# Patient Record
Sex: Female | Born: 1955
Health system: Southern US, Community
[De-identification: ages and names within clinical notes are randomized; demographics above are authoritative.]

## PROBLEM LIST (undated history)

## (undated) DIAGNOSIS — N83209 Unspecified ovarian cyst, unspecified side: Secondary | ICD-10-CM

## (undated) DIAGNOSIS — T8859XA Other complications of anesthesia, initial encounter: Secondary | ICD-10-CM

## (undated) DIAGNOSIS — Z8489 Family history of other specified conditions: Secondary | ICD-10-CM

## (undated) DIAGNOSIS — Z974 Presence of external hearing-aid: Secondary | ICD-10-CM

## (undated) DIAGNOSIS — T4145XA Adverse effect of unspecified anesthetic, initial encounter: Secondary | ICD-10-CM

## (undated) DIAGNOSIS — Z9889 Other specified postprocedural states: Secondary | ICD-10-CM

## (undated) DIAGNOSIS — T7840XA Allergy, unspecified, initial encounter: Secondary | ICD-10-CM

## (undated) DIAGNOSIS — R112 Nausea with vomiting, unspecified: Secondary | ICD-10-CM

## (undated) DIAGNOSIS — M1990 Unspecified osteoarthritis, unspecified site: Secondary | ICD-10-CM

## (undated) DIAGNOSIS — N390 Urinary tract infection, site not specified: Secondary | ICD-10-CM

## (undated) HISTORY — DX: Unspecified ovarian cyst, unspecified side: N83.209

## (undated) HISTORY — DX: Family history of other specified conditions: Z84.89

## (undated) HISTORY — DX: Urinary tract infection, site not specified: N39.0

## (undated) HISTORY — DX: Allergy, unspecified, initial encounter: T78.40XA

## (undated) HISTORY — DX: Unspecified osteoarthritis, unspecified site: M19.90

---

## 2004-09-27 ENCOUNTER — Ambulatory Visit: Payer: Self-pay | Admitting: Unknown Physician Specialty

## 2009-09-27 ENCOUNTER — Ambulatory Visit: Payer: Self-pay

## 2009-10-12 ENCOUNTER — Ambulatory Visit: Payer: Self-pay | Admitting: Podiatry

## 2012-04-23 ENCOUNTER — Ambulatory Visit (INDEPENDENT_AMBULATORY_CARE_PROVIDER_SITE_OTHER): Payer: PRIVATE HEALTH INSURANCE | Admitting: Internal Medicine

## 2012-04-23 ENCOUNTER — Encounter: Payer: Self-pay | Admitting: Internal Medicine

## 2012-04-23 DIAGNOSIS — Z1211 Encounter for screening for malignant neoplasm of colon: Secondary | ICD-10-CM | POA: Insufficient documentation

## 2012-04-23 DIAGNOSIS — Z79899 Other long term (current) drug therapy: Secondary | ICD-10-CM | POA: Insufficient documentation

## 2012-04-23 DIAGNOSIS — Z0001 Encounter for general adult medical examination with abnormal findings: Secondary | ICD-10-CM | POA: Insufficient documentation

## 2012-04-23 DIAGNOSIS — T781XXA Other adverse food reactions, not elsewhere classified, initial encounter: Secondary | ICD-10-CM

## 2012-04-23 DIAGNOSIS — Z0000 Encounter for general adult medical examination without abnormal findings: Secondary | ICD-10-CM

## 2012-04-23 DIAGNOSIS — Z1239 Encounter for other screening for malignant neoplasm of breast: Secondary | ICD-10-CM | POA: Insufficient documentation

## 2012-04-23 DIAGNOSIS — M1990 Unspecified osteoarthritis, unspecified site: Secondary | ICD-10-CM | POA: Insufficient documentation

## 2012-04-23 DIAGNOSIS — Z91018 Allergy to other foods: Secondary | ICD-10-CM | POA: Insufficient documentation

## 2012-04-23 DIAGNOSIS — M129 Arthropathy, unspecified: Secondary | ICD-10-CM

## 2012-04-23 LAB — CBC WITH DIFFERENTIAL/PLATELET
Basophils Relative: 0.5 % (ref 0.0–3.0)
Eosinophils Absolute: 0 10*3/uL (ref 0.0–0.7)
HCT: 37.9 % (ref 36.0–46.0)
Hemoglobin: 13 g/dL (ref 12.0–15.0)
Lymphocytes Relative: 28.3 % (ref 12.0–46.0)
Lymphs Abs: 1.2 10*3/uL (ref 0.7–4.0)
MCHC: 34.3 g/dL (ref 30.0–36.0)
MCV: 87.9 fl (ref 78.0–100.0)
Monocytes Absolute: 0.4 10*3/uL (ref 0.1–1.0)
Neutro Abs: 2.6 10*3/uL (ref 1.4–7.7)
RBC: 4.31 Mil/uL (ref 3.87–5.11)

## 2012-04-23 LAB — COMPREHENSIVE METABOLIC PANEL
ALT: 21 U/L (ref 0–35)
AST: 23 U/L (ref 0–37)
Calcium: 9.7 mg/dL (ref 8.4–10.5)
Chloride: 103 mEq/L (ref 96–112)
Creatinine, Ser: 0.7 mg/dL (ref 0.4–1.2)
Sodium: 137 mEq/L (ref 135–145)

## 2012-04-23 LAB — LIPID PANEL
HDL: 46.1 mg/dL (ref >39.00)
Total CHOL/HDL Ratio: 4

## 2012-04-23 NOTE — Assessment & Plan Note (Signed)
Patient reports intolerance to foods but is unsure about exact foods which trigger symptoms. Encouraged her to keep a food diary. Will set up allergy testing through allergy/immunology.

## 2012-04-23 NOTE — Assessment & Plan Note (Signed)
Symptoms in her right knee are most consistent with osteoarthritis. Other arthralgias are likely also related to OA. Will check ANA with labs today as screen for RA. Continue Glucosamine/Chondroitin.

## 2012-04-23 NOTE — Progress Notes (Signed)
Subjective:    Patient ID: Cassandra Malone, female    DOB: 1955/08/08, 57 y.o.   MRN: [id number]  HPI 57 year old female presents to establish care. She reports that she has not been seen by a physician in a couple of years. She has been generally healthy. She has 2 concerns today. First, she notes a long history of food allergy and intolerance. She is unsure what she is allergic to, but reports intermittent episodes of shortness of breath and general malaise associated with eating certain foods. She has never been tested for allergies. She denies any prior history of anaphylaxis. She denies any diarrhea, abdominal pain. She denies any weight loss.  Her second concern today is intermittent history of joint pain. She reports that this occurs to all of the joints of her body. It does not seem to be worse at any particular time. She denies joint swelling or redness. She was previously evaluated by an orthopedic surgeon for pain in her right knee and they recommended steroid injection which she declined. She does not currently take any prescription medications for joint pain. She does take glucosamine/chondroitin with some improvement.   Outpatient Encounter Prescriptions as of 04/23/2012  Medication Sig Dispense Refill  . vitamin C (ASCORBIC ACID) 500 MG tablet Take 500 mg by mouth 2 (two) times daily.       No facility-administered encounter medications on file as of 04/23/2012.   BP 128/90  Pulse 75  Temp(Src) 98.3 F (36.8 C) (Oral)  Ht 5' 3.75" (1.619 m)  Wt 150 lb (68.04 kg)  BMI 25.96 kg/m2  SpO2 96%  Review of Systems  Constitutional: Negative for fever, chills, appetite change, fatigue and unexpected weight change.  HENT: Negative for ear pain, congestion, sore throat, trouble swallowing, neck pain, voice change and sinus pressure.   Eyes: Negative for visual disturbance.  Respiratory: Negative for cough, shortness of breath, wheezing and stridor.   Cardiovascular: Negative for chest pain,  palpitations and leg swelling.  Gastrointestinal: Negative for nausea, vomiting, abdominal pain, diarrhea, constipation, blood in stool, abdominal distention and anal bleeding.  Genitourinary: Negative for dysuria and flank pain.  Musculoskeletal: Positive for arthralgias. Negative for myalgias and gait problem.  Skin: Negative for color change and rash.  Neurological: Negative for dizziness and headaches.  Hematological: Negative for adenopathy. Does not bruise/bleed easily.  Psychiatric/Behavioral: Negative for suicidal ideas, sleep disturbance and dysphoric mood. The patient is not nervous/anxious.        Objective:   Physical Exam  Constitutional: She is oriented to person, place, and time. She appears well-developed and well-nourished. No distress.  HENT:  Head: Normocephalic and atraumatic.  Right Ear: External ear normal.  Left Ear: External ear normal.  Nose: Nose normal.  Mouth/Throat: Oropharynx is clear and moist. No oropharyngeal exudate.  Eyes: Conjunctivae are normal. Pupils are equal, round, and reactive to light. Right eye exhibits no discharge. Left eye exhibits no discharge. No scleral icterus.  Neck: Normal range of motion. Neck supple. No tracheal deviation present. No thyromegaly present.  Cardiovascular: Normal rate, regular rhythm, normal heart sounds and intact distal pulses.  Exam reveals no gallop and no friction rub.   No murmur heard. Pulmonary/Chest: Effort normal and breath sounds normal. No respiratory distress. She has no wheezes. She has no rales. She exhibits no tenderness.  Abdominal: Soft. Bowel sounds are normal. She exhibits no distension and no mass. There is no tenderness. There is no rebound and no guarding.  Musculoskeletal: Normal range of motion. She  exhibits no edema and no tenderness.  Lymphadenopathy:    She has no cervical adenopathy.  Neurological: She is alert and oriented to person, place, and time. No cranial nerve deficit. She exhibits  normal muscle tone. Coordination normal.  Skin: Skin is warm and dry. No rash noted. She is not diaphoretic. No erythema. No pallor.  Psychiatric: Her speech is normal and behavior is normal. Judgment and thought content normal. Her mood appears anxious. Cognition and memory are normal.          Assessment & Plan:

## 2012-04-23 NOTE — Assessment & Plan Note (Signed)
Will schedule exam and PAP smear. Will check basic labs today including CMP, CBC, TSH, Vit D, lipids.

## 2012-04-23 NOTE — Assessment & Plan Note (Addendum)
Will screening mammogram.

## 2012-04-23 NOTE — Assessment & Plan Note (Signed)
Pt is taking numerous supplements. Discussed risks of using supplements with unknown risks and benefits. Encouraged her to consider cutting back or preferably stopping use of excessive supplements. Recommended that she stop Calcium supplement, given FDA recommendation 2013.

## 2012-04-24 ENCOUNTER — Encounter: Payer: Self-pay | Admitting: *Deleted

## 2012-04-24 ENCOUNTER — Other Ambulatory Visit: Payer: Self-pay | Admitting: *Deleted

## 2012-04-24 LAB — ANA: Anti Nuclear Antibody(ANA): NEGATIVE

## 2012-06-12 ENCOUNTER — Ambulatory Visit: Payer: Self-pay | Admitting: Internal Medicine

## 2012-07-10 ENCOUNTER — Encounter: Payer: PRIVATE HEALTH INSURANCE | Admitting: Internal Medicine

## 2012-08-01 ENCOUNTER — Encounter: Payer: PRIVATE HEALTH INSURANCE | Admitting: Internal Medicine

## 2012-10-02 ENCOUNTER — Ambulatory Visit (INDEPENDENT_AMBULATORY_CARE_PROVIDER_SITE_OTHER): Payer: PRIVATE HEALTH INSURANCE | Admitting: Internal Medicine

## 2012-10-02 ENCOUNTER — Encounter: Payer: PRIVATE HEALTH INSURANCE | Admitting: Internal Medicine

## 2012-10-02 ENCOUNTER — Encounter: Payer: Self-pay | Admitting: Internal Medicine

## 2012-10-02 VITALS — BP 114/80 | HR 68 | Temp 98.4°F

## 2012-10-02 DIAGNOSIS — N390 Urinary tract infection, site not specified: Secondary | ICD-10-CM | POA: Insufficient documentation

## 2012-10-02 LAB — POCT URINALYSIS DIPSTICK
Bilirubin, UA: NEGATIVE
Glucose, UA: NEGATIVE
Ketones, UA: NEGATIVE
Spec Grav, UA: 1.01
Urobilinogen, UA: 0.2

## 2012-10-02 MED ORDER — CIPROFLOXACIN HCL 500 MG PO TABS: 500.0000 mg | ORAL_TABLET | Freq: Two times a day (BID) | ORAL | Status: DC

## 2012-10-02 NOTE — Progress Notes (Signed)
Subjective:    Patient ID: Cassandra Malone, female    DOB: Sep 12, 1955, 57 y.o.   MRN: [id number]  HPI 57 year old female presents for acute visit complaining of several days of increased urinary frequency, dysuria described as burning with urination, urinary urgency, and low back pain. She has been taking over-the-counter remedies for UTI with no improvement. She denies flank pain, fever, chills.  Outpatient Prescriptions Prior to Visit  Medication Sig Dispense Refill  . ALPHA LIPOIC ACID PO Take 250 mg by mouth daily.      . Calcium Carbonate (CALCIUM 600 PO) Take 600 mg by mouth daily.      Marland Kitchen co-enzyme Q-10 50 MG capsule Take 100 mg by mouth daily.      . Emollient (DMAE EX) Apply topically.      . Grape Seed 100 MG CAPS Take 100 mg by mouth daily.      . Magnesium 100 MG CAPS Take 125 mg by mouth 3 (three) times daily.      . Omega-3 Fatty Acids (FISH OIL) 500 MG CAPS Take 500 mg by mouth daily.      . [place] Grape Root POWD by Does not apply route.      Marland Kitchen OVER THE COUNTER MEDICATION Take 450 mg by mouth daily. [facility]      . SPECIALTY VITAMINS PRODUCTS PO Take by mouth.      Marland Kitchen UNABLE TO FIND Take 500 mg by mouth 2 (two) times daily. Dmannose with cranactin      . vitamin C (ASCORBIC ACID) 500 MG tablet Take 500 mg by mouth 2 (two) times daily.      Marland Kitchen Cetyl Myristoleate POWD 500 mg by Does not apply route daily.      . Methylsulfonylmethane (MSM) 1000 MG CAPS Take 1,000 mg by mouth daily.       No facility-administered medications prior to visit.   BP 114/80  Pulse 68  Temp(Src) 98.4 F (36.9 C) (Oral)  SpO2 97%  Review of Systems  Constitutional: Negative for fever, chills and fatigue.  Gastrointestinal: Negative for nausea, vomiting, abdominal pain, diarrhea, constipation and rectal pain.  Genitourinary: Positive for dysuria, urgency and frequency. Negative for hematuria, flank pain, decreased urine volume, vaginal bleeding, vaginal discharge, difficulty urinating, vaginal  pain and pelvic pain.  Musculoskeletal: Positive for back pain.       Objective:   Physical Exam  Constitutional: She is oriented to person, place, and time. She appears well-developed and well-nourished. No distress.  HENT:  Head: Normocephalic and atraumatic.  Right Ear: External ear normal.  Left Ear: External ear normal.  Nose: Nose normal.  Mouth/Throat: Oropharynx is clear and moist. No oropharyngeal exudate.  Eyes: Conjunctivae are normal. Pupils are equal, round, and reactive to light. Right eye exhibits no discharge. Left eye exhibits no discharge. No scleral icterus.  Neck: Normal range of motion. Neck supple. No tracheal deviation present. No thyromegaly present.  Cardiovascular: Normal rate, regular rhythm, normal heart sounds and intact distal pulses.  Exam reveals no gallop and no friction rub.   No murmur heard. Pulmonary/Chest: Effort normal and breath sounds normal. No accessory muscle usage. Not tachypneic. No respiratory distress. She has no decreased breath sounds. She has no wheezes. She has no rhonchi. She has no rales. She exhibits no tenderness.  Musculoskeletal: Normal range of motion. She exhibits no edema and no tenderness.  Lymphadenopathy:    She has no cervical adenopathy.  Neurological: She is alert and oriented to person, place,  and time. No cranial nerve deficit. She exhibits normal muscle tone. Coordination normal.  Skin: Skin is warm and dry. No rash noted. She is not diaphoretic. No erythema. No pallor.  Psychiatric: She has a normal mood and affect. Her behavior is normal. Judgment and thought content normal.          Assessment & Plan:

## 2012-10-02 NOTE — Assessment & Plan Note (Signed)
Symptoms consistent with urinary tract infection. Urinalysis positive for blood. Will send urine for culture. Will start empiric Cipro. Encouraged increased fluid intake and use of Pyridium as needed. Patient will followup if symptoms are not improving or if any new symptoms develop including flank pain or fever.

## 2012-10-04 LAB — URINE CULTURE
Colony Count: NO GROWTH
Organism ID, Bacteria: NO GROWTH

## 2012-10-09 ENCOUNTER — Telehealth: Payer: Self-pay | Admitting: Internal Medicine

## 2012-10-09 ENCOUNTER — Ambulatory Visit: Payer: Self-pay | Admitting: Internal Medicine

## 2012-10-09 ENCOUNTER — Ambulatory Visit (INDEPENDENT_AMBULATORY_CARE_PROVIDER_SITE_OTHER): Payer: PRIVATE HEALTH INSURANCE | Admitting: Internal Medicine

## 2012-10-09 ENCOUNTER — Encounter: Payer: Self-pay | Admitting: Internal Medicine

## 2012-10-09 VITALS — BP 120/90 | HR 66 | Temp 98.3°F

## 2012-10-09 DIAGNOSIS — R319 Hematuria, unspecified: Secondary | ICD-10-CM

## 2012-10-09 DIAGNOSIS — N83202 Unspecified ovarian cyst, left side: Secondary | ICD-10-CM

## 2012-10-09 DIAGNOSIS — R109 Unspecified abdominal pain: Secondary | ICD-10-CM

## 2012-10-09 LAB — POCT URINALYSIS DIPSTICK
Glucose, UA: NEGATIVE
Leukocytes, UA: NEGATIVE
Nitrite, UA: NEGATIVE
Urobilinogen, UA: 0.2

## 2012-10-09 NOTE — Assessment & Plan Note (Signed)
Hematuria noted on urinalysis today and last week. Urine culture negative. CT shows no evidence of nephrolithiasis. Will send formal UA. If pos for Hgb, will set up urology evaluation for possible cystoscopy.

## 2012-10-09 NOTE — Progress Notes (Signed)
Subjective:    Patient ID: Cassandra Malone, female    DOB: Jun 25, 1955, 57 y.o.   MRN: [id number]  HPI 56YO female presents for follow up dysuria. She was seen 1 week ago with complaints of burning with urination. Urinalysis was pos for blood. She was treated with Cipro. Urine culture was negative. Her symptoms have been persistent. She also describes pressure or pain in her right lower pelvis. She has not had any change in bowel habits, nausea, fever, chills. She occasionally has mild low back pain described as aching.  She has a history of UTIs in the past, which required several weeks of antibiotics. She has no history of nephrolithiasis. She has a history of ovarian cysts in the past.  Outpatient Encounter Prescriptions as of 10/09/2012  Medication Sig Dispense Refill  . ALPHA LIPOIC ACID PO Take 250 mg by mouth daily.      . Calcium Carbonate (CALCIUM 600 PO) Take 600 mg by mouth daily.      Marland Kitchen Cetyl Myristoleate POWD 500 mg by Does not apply route daily.      . ciprofloxacin (CIPRO) 500 MG tablet Take 1 tablet (500 mg total) by mouth 2 (two) times daily.  14 tablet  0  . co-enzyme Q-10 50 MG capsule Take 100 mg by mouth daily.      . D-MANNOSE PO Take by mouth 2 (two) times daily.      . Emollient (DMAE EX) Apply topically.      . Grape Seed 100 MG CAPS Take 100 mg by mouth daily.      . Magnesium 100 MG CAPS Take 125 mg by mouth 3 (three) times daily.      . Methylsulfonylmethane (MSM) 1000 MG CAPS Take 1,000 mg by mouth daily.      . Omega-3 Fatty Acids (FISH OIL) 500 MG CAPS Take 500 mg by mouth daily.      . [place] Grape Root POWD by Does not apply route.      Marland Kitchen OVER THE COUNTER MEDICATION Take 450 mg by mouth daily. [facility]      . SPECIALTY VITAMINS PRODUCTS PO Take by mouth.      Marland Kitchen UNABLE TO FIND Take 500 mg by mouth 2 (two) times daily. Dmannose with cranactin      . vitamin C (ASCORBIC ACID) 500 MG tablet Take 500 mg by mouth 2 (two) times daily.       No facility-administered  encounter medications on file as of 10/09/2012.   BP 120/90  Pulse 66  Temp(Src) 98.3 F (36.8 C) (Oral)  SpO2 98%  Review of Systems  Constitutional: Negative for fever, chills and fatigue.  Gastrointestinal: Positive for abdominal pain (right lower abdominal ). Negative for nausea, vomiting, diarrhea, constipation and rectal pain.  Genitourinary: Positive for dysuria and flank pain. Negative for urgency, frequency, hematuria, decreased urine volume, vaginal bleeding, vaginal discharge, difficulty urinating, vaginal pain and pelvic pain.       Objective:   Physical Exam  Constitutional: She is oriented to person, place, and time. She appears well-developed and well-nourished. No distress.  HENT:  Head: Normocephalic and atraumatic.  Right Ear: External ear normal.  Left Ear: External ear normal.  Nose: Nose normal.  Mouth/Throat: Oropharynx is clear and moist. No oropharyngeal exudate.  Eyes: Conjunctivae are normal. Pupils are equal, round, and reactive to light. Right eye exhibits no discharge. Left eye exhibits no discharge. No scleral icterus.  Neck: Normal range of motion. Neck supple. No tracheal  deviation present. No thyromegaly present.  Cardiovascular: Normal rate, regular rhythm, normal heart sounds and intact distal pulses.  Exam reveals no gallop and no friction rub.   No murmur heard. Pulmonary/Chest: Effort normal and breath sounds normal. No accessory muscle usage. Not tachypneic. No respiratory distress. She has no decreased breath sounds. She has no wheezes. She has no rhonchi. She has no rales. She exhibits no tenderness.  Abdominal: Soft. Bowel sounds are normal. She exhibits no distension and no mass. There is tenderness (right lower quadrant mild). There is no rebound and no guarding.  Musculoskeletal: Normal range of motion. She exhibits no edema and no tenderness.  Lymphadenopathy:    She has no cervical adenopathy.  Neurological: She is alert and oriented to  person, place, and time. No cranial nerve deficit. She exhibits normal muscle tone. Coordination normal.  Skin: Skin is warm and dry. No rash noted. She is not diaphoretic. No erythema. No pallor.  Psychiatric: She has a normal mood and affect. Her behavior is normal. Judgment and thought content normal.          Assessment & Plan:

## 2012-10-09 NOTE — Telephone Encounter (Signed)
Patient stopped by and asked if she was still supposed to be taking cipro and if so she uses [facility]. Please call patient and advise if she needs to continue taking medication.

## 2012-10-09 NOTE — Telephone Encounter (Signed)
Received report on pt CT abdomen. No kidney stones were seen, however there is a 3cm left ovarian cyst. I would like to get a pelvic ultrasound for further evaluation. I will place order. We will need to let pt know. We had discussed possible [place]. She can stop the Cipro. Please have Eber Jones send her urine for formal urinalysis in the lab.

## 2012-10-09 NOTE — Assessment & Plan Note (Signed)
Unclear etiology of right flank and pelvic pain. Hematuria noted again today on urinalysis. However, CT abdomen showed no nephrolithiasis. Will proceed with pelvic ultrasound to further evaluate ovarian cyst seen in the left adenexa. Will also send urine for formal urinalysis.

## 2012-10-09 NOTE — Telephone Encounter (Signed)
Patient informed and verbalized understanding

## 2012-10-10 ENCOUNTER — Ambulatory Visit: Payer: Self-pay | Admitting: Internal Medicine

## 2012-10-10 LAB — URINALYSIS
Hgb urine dipstick: NEGATIVE
Urine Glucose: NEGATIVE
Urobilinogen, UA: 0.2 (ref 0.0–1.0)

## 2012-10-10 NOTE — Addendum Note (Signed)
Addended by: Montine Circle D on: 10/10/2012 08:22 AM   Modules accepted: Orders

## 2012-10-13 ENCOUNTER — Telehealth: Payer: Self-pay | Admitting: Internal Medicine

## 2012-10-13 DIAGNOSIS — N83209 Unspecified ovarian cyst, unspecified side: Secondary | ICD-10-CM

## 2012-10-13 NOTE — Telephone Encounter (Signed)
Recent pelvic ultrasound on 9/19 showed left ovarian cyst measuring 3.39cm diameter. I would recommend that we set up GYN evaluation. The cyst appears to be a simple cyst with no other abnormalities, however we should have them follow it with serial ultrasounds to make sure it resolves.

## 2012-10-13 NOTE — Telephone Encounter (Signed)
Spoke with patient, she state she went through this a couple years ago with a GYN. She think she gave you a copy of this information from Dr. Dear. But she is concerned about what is wrong with her, she has finished the antibiotics like she was supposed to do but she still does not feel like herself. She would like to know what is wrong with her.

## 2012-10-13 NOTE — Telephone Encounter (Signed)
OK. Let's make her a follow up visit and set up GYN evaluation.

## 2012-10-13 NOTE — Telephone Encounter (Signed)
Left message to call back  

## 2012-10-13 NOTE — Telephone Encounter (Signed)
Cassandra Malone, when she calls back will you ask her a preference on who she would like to see.

## 2012-10-14 NOTE — Telephone Encounter (Signed)
I called patient to double check that Dr. Dear is who she wants to see. The patient states she has seen Dr. Dear and she said that she had the same L cyst when visiting her in the past. She said she was feeling 100x better but now she's starting to have that same cramping sensation in the R side, its not as bad as it was when she came in however she feels as if whatever this is might be coming back. She says this isn't the first time she's has these issues but once taken the antibiotic she is normally better, but doesn't understand how nothing can show in her urine or imaging.

## 2012-10-14 NOTE — Telephone Encounter (Signed)
Well. We can repeat a urinalysis and urine culture and we should see her back in a visit.

## 2012-10-17 NOTE — Telephone Encounter (Signed)
Left message to call back  

## 2012-10-21 NOTE — Telephone Encounter (Signed)
Left detailed message on patient voicemail informing her to come in for an urine sample, no appointment needed.

## 2012-10-24 ENCOUNTER — Encounter: Payer: Self-pay | Admitting: Internal Medicine

## 2012-11-05 ENCOUNTER — Encounter: Payer: PRIVATE HEALTH INSURANCE | Admitting: Internal Medicine

## 2012-11-20 ENCOUNTER — Ambulatory Visit (INDEPENDENT_AMBULATORY_CARE_PROVIDER_SITE_OTHER): Payer: PRIVATE HEALTH INSURANCE | Admitting: Internal Medicine

## 2012-11-20 ENCOUNTER — Encounter: Payer: Self-pay | Admitting: Internal Medicine

## 2012-11-20 VITALS — BP 112/80 | HR 59 | Temp 98.3°F

## 2012-11-20 DIAGNOSIS — N83202 Unspecified ovarian cyst, left side: Secondary | ICD-10-CM | POA: Insufficient documentation

## 2012-11-20 DIAGNOSIS — R109 Unspecified abdominal pain: Secondary | ICD-10-CM

## 2012-11-20 DIAGNOSIS — N83209 Unspecified ovarian cyst, unspecified side: Secondary | ICD-10-CM

## 2012-11-20 NOTE — Assessment & Plan Note (Signed)
Left ovarian cyst 3.39 cm noted on US pelvis. Encouraged pt to follow up with her OB for repeat US to make sure that cyst resolves. No current symptoms of pain. Pt will call if any new symptoms develop

## 2012-11-20 NOTE — Assessment & Plan Note (Signed)
Symptoms have completely resolved. Question if she may have had a small stone we did not catch on CT. Will continue to monitor for any recurrence.

## 2012-11-20 NOTE — Progress Notes (Signed)
Subjective:    Patient ID: Cassandra Malone, female    DOB: Mar 11, 1955, 57 y.o.   MRN: [id number]  HPI 57YO female with recent episode of right LQ pain presents for follow up. CT abdomen showed possible left ovarian cyst. Follow up US pelvis confirmed 3.39 cm left ovarian cyst. Pt reports pain in right side of abdomen has completely resolved. No pain, nausea, vomiting, change in bowel habits, fever, chills, change in appetite. No urinary symptoms such as dysuria, hematuria, urgency, frequency. She is planning to set up evaluation with her GYN for repeat US, however has not yet done this. No new concerns today.   Outpatient Encounter Prescriptions as of 11/20/2012  Medication Sig Dispense Refill  . ALPHA LIPOIC ACID PO Take 250 mg by mouth daily.      . Calcium Carbonate (CALCIUM 600 PO) Take 600 mg by mouth daily.      Marland Kitchen Cetyl Myristoleate POWD 500 mg by Does not apply route daily.      Marland Kitchen co-enzyme Q-10 50 MG capsule Take 100 mg by mouth daily.      . D-MANNOSE PO Take by mouth 2 (two) times daily.      . Emollient (DMAE EX) Apply topically.      . Grape Seed 100 MG CAPS Take 100 mg by mouth daily.      . Magnesium 100 MG CAPS Take 125 mg by mouth 3 (three) times daily.      . Methylsulfonylmethane (MSM) 1000 MG CAPS Take 1,000 mg by mouth daily.      . Omega-3 Fatty Acids (FISH OIL) 500 MG CAPS Take 500 mg by mouth daily.      . [place] Grape Root POWD by Does not apply route.      Marland Kitchen OVER THE COUNTER MEDICATION Take 450 mg by mouth daily. [facility]      . SPECIALTY VITAMINS PRODUCTS PO Take by mouth.      Marland Kitchen UNABLE TO FIND Take 500 mg by mouth 2 (two) times daily. Dmannose with cranactin      . vitamin C (ASCORBIC ACID) 500 MG tablet Take 500 mg by mouth 2 (two) times daily.      . [DISCONTINUED] ciprofloxacin (CIPRO) 500 MG tablet Take 1 tablet (500 mg total) by mouth 2 (two) times daily.  14 tablet  0   No facility-administered encounter medications on file as of 11/20/2012.   BP 112/80   Pulse 59  Temp(Src) 98.3 F (36.8 C) (Oral)  SpO2 98%  Review of Systems  Constitutional: Negative for fever, chills, appetite change, fatigue and unexpected weight change.  HENT: Negative for congestion.   Eyes: Negative for pain, discharge, redness and visual disturbance.  Respiratory: Negative for cough, chest tightness, shortness of breath, wheezing and stridor.   Cardiovascular: Negative for chest pain, palpitations and leg swelling.  Gastrointestinal: Negative for nausea, vomiting, abdominal pain, diarrhea, constipation, blood in stool, abdominal distention and anal bleeding.  Genitourinary: Negative for dysuria and flank pain.  Musculoskeletal: Negative for arthralgias, gait problem, myalgias, neck pain and neck stiffness.  Skin: Negative for color change and rash.  Neurological: Negative for dizziness, weakness, light-headedness and headaches.  Hematological: Negative for adenopathy. Does not bruise/bleed easily.  Psychiatric/Behavioral: Negative for suicidal ideas, sleep disturbance and dysphoric mood. The patient is not nervous/anxious.        Objective:   Physical Exam  Constitutional: She is oriented to person, place, and time. She appears well-developed and well-nourished. No distress.  HENT:  Head: Normocephalic and atraumatic.  Right Ear: External ear normal.  Left Ear: External ear normal.  Nose: Nose normal.  Mouth/Throat: Oropharynx is clear and moist. No oropharyngeal exudate.  Eyes: Conjunctivae are normal. Pupils are equal, round, and reactive to light. Right eye exhibits no discharge. Left eye exhibits no discharge. No scleral icterus.  Neck: Normal range of motion. Neck supple. No tracheal deviation present. No thyromegaly present.  Cardiovascular: Normal rate, regular rhythm, normal heart sounds and intact distal pulses.  Exam reveals no gallop and no friction rub.   No murmur heard. Pulmonary/Chest: Effort normal and breath sounds normal. No accessory muscle  usage. Not tachypneic. No respiratory distress. She has no decreased breath sounds. She has no wheezes. She has no rhonchi. She has no rales. She exhibits no tenderness.  Abdominal: Soft. Bowel sounds are normal. She exhibits no distension and no mass. There is no tenderness. There is no rebound and no guarding.  Musculoskeletal: Normal range of motion. She exhibits no edema and no tenderness.  Lymphadenopathy:    She has no cervical adenopathy.  Neurological: She is alert and oriented to person, place, and time. No cranial nerve deficit. She exhibits normal muscle tone. Coordination normal.  Skin: Skin is warm and dry. No rash noted. She is not diaphoretic. No erythema. No pallor.  Psychiatric: She has a normal mood and affect. Her behavior is normal. Judgment and thought content normal.          Assessment & Plan:

## 2013-01-19 ENCOUNTER — Telehealth: Payer: Self-pay | Admitting: Internal Medicine

## 2013-01-19 NOTE — Telephone Encounter (Signed)
FYI

## 2013-01-19 NOTE — Telephone Encounter (Signed)
Patient Information:  Caller Name: [place]  Phone: [phone]  Patient: Cassandra Malone, Cassandra Malone  Gender: Female  DOB: January 27, 1955  Age: 58 Years  PCP: Ronna Polio (Adults only)  Office Follow Up:  Does the office need to follow up with this patient?: No  Instructions For The Office: N/A   Symptoms  Reason For Call & Symptoms: Has nasal drainage/cough since 12-23. Cough began 12-27. Is productive cough. Mucus is yellow in color.  Reviewed Health History In EMR: Yes  Reviewed Medications In EMR: Yes  Reviewed Allergies In EMR: Yes  Reviewed Surgeries / Procedures: Yes  Date of Onset of Symptoms: 01/13/2013  Guideline(s) Used:  Cough  Disposition Per Guideline:   Home Care  Reason For Disposition Reached:   Cough with no complications  Advice Given:  Cough Medicines:  OTC Cough Drops: Cough drops can help a lot, especially for mild coughs. They reduce coughing by soothing your irritated throat and removing that tickle sensation in the back of the throat. Cough drops also have the advantage of portability - you can carry them with you.  Coughing Spasms:  Drink warm fluids. Inhale warm mist (Reason: both relax the airway and loosen up the phlegm).  Suck on cough drops or hard candy to coat the irritated throat.  Call Back If:  Difficulty breathing  Cough lasts more than 3 weeks  You become worse.  Patient Will Follow Care Advice:  YES

## 2013-10-20 ENCOUNTER — Ambulatory Visit (INDEPENDENT_AMBULATORY_CARE_PROVIDER_SITE_OTHER): Payer: PRIVATE HEALTH INSURANCE | Admitting: Internal Medicine

## 2013-10-20 ENCOUNTER — Encounter: Payer: Self-pay | Admitting: Internal Medicine

## 2013-10-20 ENCOUNTER — Ambulatory Visit: Payer: PRIVATE HEALTH INSURANCE | Admitting: Internal Medicine

## 2013-10-20 VITALS — BP 110/70 | HR 82 | Temp 98.2°F | Wt 128.0 lb

## 2013-10-20 DIAGNOSIS — M545 Low back pain, unspecified: Secondary | ICD-10-CM

## 2013-10-20 DIAGNOSIS — R3915 Urgency of urination: Secondary | ICD-10-CM

## 2013-10-20 LAB — POCT URINALYSIS DIPSTICK
Glucose, UA: NEGATIVE
Ketones, UA: NEGATIVE
NITRITE UA: NEGATIVE
Protein, UA: NEGATIVE
RBC UA: NEGATIVE
SPEC GRAV UA: 1.01
Urobilinogen, UA: NEGATIVE
pH, UA: 6

## 2013-10-20 MED ORDER — NITROFURANTOIN MONOHYD MACRO 100 MG PO CAPS: 100.0000 mg | ORAL_CAPSULE | Freq: Two times a day (BID) | ORAL | Status: DC

## 2013-10-20 NOTE — Progress Notes (Signed)
HPI  Pt presents to the clinic today with c/o urinary urgency, back pain and bladder pressure. She reports this started 4 days ago. She denies pain with urination or blood in urine. She denies fever, chills or nausea. She reports that she had some leftover antibiotic from a previous UTI, and cranberry tablets with some relief.   Review of Systems  Past Medical History  Diagnosis Date  . Arthritis   . Allergy     food, never tested    Family History  Problem Relation Age of Onset  . Adopted: Yes    History   Social History  . Marital Status: Married    Spouse Name: N/A    Number of Children: N/A  . Years of Education: N/A   Occupational History  . Not on file.   Social History Main Topics  . Smoking status: Never Smoker   . Smokeless tobacco: Never Used  . Alcohol Use: No  . Drug Use: No  . Sexual Activity: Not on file   Other Topics Concern  . Not on file   Social History Narrative   [place] in [place]. No children. Dog/cat.      Work - [employer]      Diet - mostly vegetarian diet x chicken      Exercise - 3 days per week at [employer], treadmill    Allergies  Allergen Reactions  . Latex     Constitutional: Denies fever, malaise, fatigue, headache or abrupt weight changes.   GU: Pt reports urgency, frequency. Denies dysuria, burning sensation, blood in urine, odor or discharge. Skin: Denies redness, rashes, lesions or ulcercations.   No other specific complaints in a complete review of systems (except as listed in HPI above).    Objective:   Physical Exam  BP 110/70  Pulse 82  Temp(Src) 98.2 F (36.8 C) (Oral)  Wt 128 lb (58.06 kg)  SpO2 98%   Wt Readings from Last 3 Encounters:  04/23/12 150 lb (68.04 kg)    General: Appears her stated age, well developed, well nourished in NAD. Cardiovascular: Normal rate and rhythm. S1,S2 noted.  No murmur, rubs or gallops noted.  Pulmonary/Chest: Normal effort and positive  vesicular breath sounds. No respiratory distress. No wheezes, rales or ronchi noted.  Abdomen: Soft and nontender. Normal bowel sounds, no bruits noted. No distention or masses noted. Liver, spleen and kidneys non palpable. No CVA tenderness.      Assessment & Plan:   Urgency, Frequency secondary to possible UTI  Urinalysis: normal - but has been taking antibiotic eRx sent if for Macrobid 100 mg BID x 5 days OK to take AZO OTC Drink plenty of fluids  RTC as needed or if symptoms persist.

## 2013-10-20 NOTE — Addendum Note (Signed)
Addended by: Roena MaladyEVONTENNO, Samul Mcinroy Y on: 10/20/2013 04:48 PM   Modules accepted: Orders

## 2013-10-20 NOTE — Patient Instructions (Addendum)

## 2013-10-20 NOTE — Progress Notes (Signed)
Pre visit review using our clinic review tool, if applicable. No additional management support is needed unless otherwise documented below in the visit note. 

## 2013-10-20 NOTE — Progress Notes (Signed)
   Subjective:    Patient ID: Cassandra Malone, female    DOB: December 02, 1955, 58 y.o.   MRN: [id number]  HPI  HPI  Pt presents to the clinic today with c/o urinary symptoms of frequency for the past 5 days, she denies dysuria, hematuria or fever.  She took one macrobid yesterday from a previous 2012 script. She reports that she gets UTI's but she never has bacteria in her urine.  She also took D.mannose and cranberry tablets with some relief.  Review of Systems  Past Medical History  Diagnosis Date  . Arthritis   . Allergy     food, never tested    Family History  Problem Relation Age of Onset  . Adopted: Yes    History   Social History  . Marital Status: Married    Spouse Name: N/A    Number of Children: N/A  . Years of Education: N/A   Occupational History  . Not on file.   Social History Main Topics  . Smoking status: Never Smoker   . Smokeless tobacco: Never Used  . Alcohol Use: No  . Drug Use: No  . Sexual Activity: Not on file   Other Topics Concern  . Not on file   Social History Narrative   Lives in [place] with husband. No children. Dog/cat.      Work - [employer]      Diet - mostly vegetarian diet x chicken      Exercise - 3 days per week at [employer], treadmill    Allergies  Allergen Reactions  . Latex     Constitutional: Denies fever, malaise, fatigue, headache or abrupt weight changes.   GU: Pt reports urgency. Denies burning sensation, blood in urine, odor or discharge. Skin: Denies redness, rashes, lesions or ulcercations.   No other specific complaints in a complete review of systems (except as listed in HPI above).    Objective:   Physical Exam  BP 110/70  Pulse 82  Temp(Src) 98.2 F (36.8 C) (Oral)  Wt 128 lb (58.06 kg)  SpO2 98% Wt Readings from Last 3 Encounters:  10/20/13 128 lb (58.06 kg)  04/23/12 150 lb (68.04 kg)    General: Appears her stated age, well developed, well nourished in NAD. Cardiovascular:  Normal rate and rhythm. S1,S2 noted.  No murmur, rubs or gallops noted. No JVD or BLE edema. No carotid bruits noted. Pulmonary/Chest: Normal effort and positive vesicular breath sounds. No respiratory distress. No wheezes, rales or ronchi noted.  Abdomen: Soft and nontender. Normal bowel sounds, no bruits noted. No distention or masses noted. Liver, spleen and kidneys non palpable. Nontender to palpation over the bladder area. No CVA tenderness.      Assessment & Plan:   Urgency, Frequency, Dysuria secondary to   Urinalysis: eRx sent if for Macrobid 100 mg BID x 5 days OK to take AZO OTC Drink plenty of fluids  RTC as needed or if symptoms persist.   Review of Systems     Objective:   Physical Exam        Assessment & Plan:

## 2013-10-22 LAB — URINE CULTURE
Colony Count: NO GROWTH
Organism ID, Bacteria: NO GROWTH

## 2013-10-26 ENCOUNTER — Telehealth: Payer: Self-pay | Admitting: Internal Medicine

## 2013-10-26 NOTE — Telephone Encounter (Signed)
Pt called and would like c/b to discuss her visit on 10/20/13.

## 2013-10-27 NOTE — Telephone Encounter (Signed)
Left message on voicemail.

## 2013-11-03 LAB — HM DIABETES EYE EXAM

## 2013-11-03 LAB — HM PAP SMEAR: HM Pap smear: NEGATIVE

## 2014-05-13 ENCOUNTER — Ambulatory Visit
Admit: 2014-05-13 | Disposition: A | Discharge status: Home / Self Care | Payer: Self-pay | Attending: Orthopedic Surgery | Admitting: Orthopedic Surgery

## 2014-05-20 ENCOUNTER — Ambulatory Visit: Payer: PRIVATE HEALTH INSURANCE

## 2014-05-27 ENCOUNTER — Ambulatory Visit
Admission: RE | Admit: 2014-05-27 | Discharge: 2014-05-27 | Disposition: A | Discharge status: Home / Self Care | Payer: PRIVATE HEALTH INSURANCE | Source: Ambulatory Visit | Attending: Orthopedic Surgery | Admitting: Orthopedic Surgery

## 2014-05-27 ENCOUNTER — Ambulatory Visit: Payer: PRIVATE HEALTH INSURANCE | Admitting: Anesthesiology

## 2014-05-27 ENCOUNTER — Encounter
Admission: RE | Disposition: A | Discharge status: Home / Self Care | Payer: Self-pay | Source: Ambulatory Visit | Attending: Orthopedic Surgery

## 2014-05-27 ENCOUNTER — Encounter: Payer: Self-pay | Admitting: *Deleted

## 2014-05-27 DIAGNOSIS — S83232A Complex tear of medial meniscus, current injury, left knee, initial encounter: Secondary | ICD-10-CM | POA: Diagnosis present

## 2014-05-27 DIAGNOSIS — X58XXXA Exposure to other specified factors, initial encounter: Secondary | ICD-10-CM | POA: Insufficient documentation

## 2014-05-27 DIAGNOSIS — M659 Synovitis and tenosynovitis, unspecified: Secondary | ICD-10-CM | POA: Insufficient documentation

## 2014-05-27 DIAGNOSIS — Z91040 Latex allergy status: Secondary | ICD-10-CM | POA: Diagnosis not present

## 2014-05-27 HISTORY — PX: KNEE ARTHROSCOPY WITH MEDIAL MENISECTOMY: SHX5651

## 2014-05-27 SURGERY — ARTHROSCOPY, KNEE, WITH MEDIAL MENISCECTOMY
Anesthesia: General | Site: Knee | Laterality: Left | Wound class: Clean

## 2014-05-27 MED ORDER — MIDAZOLAM HCL 2 MG/2ML IJ SOLN
INTRAMUSCULAR | Status: DC | PRN
  Administered 2014-05-27: 2 mg via INTRAVENOUS

## 2014-05-27 MED ORDER — FAMOTIDINE 20 MG PO TABS
20.0000 mg | ORAL_TABLET | Freq: Once | ORAL | Status: AC
  Administered 2014-05-27: 20 mg via ORAL

## 2014-05-27 MED ORDER — ONDANSETRON HCL 4 MG/2ML IJ SOLN
INTRAMUSCULAR | Status: DC | PRN
  Administered 2014-05-27: 4 mg via INTRAVENOUS

## 2014-05-27 MED ORDER — LIDOCAINE HCL (CARDIAC) 20 MG/ML IV SOLN
INTRAVENOUS | Status: DC | PRN
Start: 2014-05-27 — End: 2014-05-27
  Administered 2014-05-27: 50 mg via INTRAVENOUS

## 2014-05-27 MED ORDER — BUPIVACAINE-EPINEPHRINE (PF) 0.5% -1:200000 IJ SOLN
INTRAMUSCULAR | Status: DC | PRN
  Administered 2014-05-27: 20 mL

## 2014-05-27 MED ORDER — FENTANYL CITRATE (PF) 100 MCG/2ML IJ SOLN
INTRAMUSCULAR | Status: DC | PRN
  Administered 2014-05-27: 100 ug via INTRAVENOUS

## 2014-05-27 MED ORDER — ACETAMINOPHEN 10 MG/ML IV SOLN
INTRAVENOUS | Status: AC
Start: 2014-05-27 — End: 2014-05-27
  Administered 2014-05-27: 1000 mg via INTRAVENOUS
  Filled 2014-05-27: qty 100

## 2014-05-27 MED ORDER — PROPOFOL 10 MG/ML IV BOLUS
INTRAVENOUS | Status: DC | PRN
  Administered 2014-05-27: 200 mg via INTRAVENOUS
  Administered 2014-05-27: 100 mg via INTRAVENOUS

## 2014-05-27 MED ORDER — ONDANSETRON HCL 4 MG/2ML IJ SOLN
4.0000 mg | Freq: Once | INTRAMUSCULAR | Status: AC | PRN
  Administered 2014-05-27: 4 mg via INTRAVENOUS

## 2014-05-27 MED ORDER — BUPIVACAINE-EPINEPHRINE (PF) 0.5% -1:200000 IJ SOLN
INTRAMUSCULAR | Status: AC
  Filled 2014-05-27: qty 30

## 2014-05-27 MED ORDER — FENTANYL CITRATE (PF) 100 MCG/2ML IJ SOLN
INTRAMUSCULAR | Status: AC
  Filled 2014-05-27: qty 2

## 2014-05-27 MED ORDER — FAMOTIDINE 20 MG PO TABS
ORAL_TABLET | ORAL | Status: AC
  Filled 2014-05-27: qty 1

## 2014-05-27 MED ORDER — DEXAMETHASONE SODIUM PHOSPHATE 10 MG/ML IJ SOLN
INTRAMUSCULAR | Status: DC | PRN
  Administered 2014-05-27: 8 mg via INTRAVENOUS

## 2014-05-27 MED ORDER — LACTATED RINGERS IV SOLN
INTRAVENOUS | Status: DC
  Administered 2014-05-27 (×2): via INTRAVENOUS

## 2014-05-27 MED ORDER — EPHEDRINE SULFATE 50 MG/ML IJ SOLN
INTRAMUSCULAR | Status: DC | PRN
  Administered 2014-05-27: 10 mg via INTRAVENOUS

## 2014-05-27 MED ORDER — ONDANSETRON HCL 4 MG/2ML IJ SOLN
INTRAMUSCULAR | Status: AC
  Administered 2014-05-27: 4 mg
  Filled 2014-05-27: qty 2

## 2014-05-27 MED ORDER — FENTANYL CITRATE (PF) 100 MCG/2ML IJ SOLN
25.0000 ug | INTRAMUSCULAR | Status: AC
  Administered 2014-05-27 (×4): 25 ug via INTRAVENOUS

## 2014-05-27 MED ORDER — HYDROCODONE-ACETAMINOPHEN 5-325 MG PO TABS: 1.0000 | ORAL_TABLET | Freq: Four times a day (QID) | ORAL | Status: DC | PRN

## 2014-05-27 MED ORDER — HYDROMORPHONE HCL 1 MG/ML IJ SOLN: 0.2500 mg | INTRAMUSCULAR | Status: DC | PRN

## 2014-05-27 SURGICAL SUPPLY — 30 items
BANDAGE ELASTIC 4 CLIP NS LF (GAUZE/BANDAGES/DRESSINGS) IMPLANT
BANDAGE ELASTIC 4 CLIP ST LF (GAUZE/BANDAGES/DRESSINGS) ×2 IMPLANT
BLADE FULL RADIUS 3.5 (BLADE) IMPLANT
BLADE INCISOR PLUS 4.5 (BLADE) IMPLANT
BLADE SHAVER 4.5 DBL SERAT CV (CUTTER) IMPLANT
BLADE SHAVER 4.5X7 STR FR (MISCELLANEOUS) ×2 IMPLANT
CAST PADDING 3X4FT ST 30246 (SOFTGOODS) ×1
CHLORAPREP W/TINT 26ML (MISCELLANEOUS) ×2 IMPLANT
GAUZE PETRO XEROFOAM 1X8 (MISCELLANEOUS) ×2 IMPLANT
GAUZE SPONGE 4X4 12PLY STRL (GAUZE/BANDAGES/DRESSINGS) ×2 IMPLANT
GAUZE XEROFORM 4X4 STRL (GAUZE/BANDAGES/DRESSINGS) ×2 IMPLANT
GLOVE BIOGEL PI IND STRL 9 (GLOVE) ×1 IMPLANT
GLOVE BIOGEL PI INDICATOR 9 (GLOVE) ×1
GLOVE SURG ORTHO 9.0 STRL STRW (GLOVE) ×2 IMPLANT
GOWN SPECIALTY ULTRA XL (MISCELLANEOUS) ×2 IMPLANT
GOWN STRL REUS W/ TWL LRG LVL3 (GOWN DISPOSABLE) ×1 IMPLANT
GOWN STRL REUS W/TWL LRG LVL3 (GOWN DISPOSABLE) ×1
IV LACTATED RINGER IRRG 3000ML (IV SOLUTION) ×4
IV LR IRRIG 3000ML ARTHROMATIC (IV SOLUTION) ×4 IMPLANT
KIT RM TURNOVER STRD PROC AR (KITS) ×2 IMPLANT
MANIFOLD NEPTUNE II (INSTRUMENTS) ×2 IMPLANT
PACK ARTHROSCOPY KNEE (MISCELLANEOUS) ×2 IMPLANT
PAD CAST CTTN 3X4 STRL (SOFTGOODS) ×1 IMPLANT
SET TUBE SUCT SHAVER OUTFL 24K (TUBING) ×2 IMPLANT
SET TUBE TIP INTRA-ARTICULAR (MISCELLANEOUS) ×2 IMPLANT
SUT ETHILON 4-0 (SUTURE) ×1
SUT ETHILON 4-0 FS2 18XMFL BLK (SUTURE) ×1
SUTURE ETHLN 4-0 FS2 18XMF BLK (SUTURE) ×1 IMPLANT
TUBING ARTHRO INFLOW-ONLY STRL (TUBING) ×2 IMPLANT
WAND HAND CNTRL MULTIVAC 50 (MISCELLANEOUS) ×2 IMPLANT

## 2014-05-27 NOTE — Anesthesia Preprocedure Evaluation (Signed)
Anesthesia Evaluation  Patient identified by MRN, date of birth, ID band Patient awake    Reviewed: Allergy & Precautions, NPO status , Patient's Chart, lab work & pertinent test results  History of Anesthesia Complications Negative for: history of anesthetic complications  Airway Mallampati: II  TM Distance: >3 FB Neck ROM: Full    Dental no notable dental hx.    Pulmonary asthma ,    Pulmonary exam normal       Cardiovascular Exercise Tolerance: Good negative cardio ROS Normal cardiovascular exam    Neuro/Psych negative neurological ROS  negative psych ROS   GI/Hepatic negative GI ROS, Neg liver ROS,   Endo/Other  negative endocrine ROS  Renal/GU negative Renal ROS     Musculoskeletal  (+) Arthritis -, Osteoarthritis,    Abdominal   Peds  Hematology negative hematology ROS (+)   Anesthesia Other Findings   Reproductive/Obstetrics negative OB ROS                             Anesthesia Physical Anesthesia Plan  ASA: II  Anesthesia Plan: General   Post-op Pain Management:    Induction: Intravenous  Airway Management Planned: LMA  Additional Equipment:   Intra-op Plan:   Post-operative Plan: Extubation in OR  Informed Consent: I have reviewed the patients History and Physical, chart, labs and discussed the procedure including the risks, benefits and alternatives for the proposed anesthesia with the patient or authorized representative who has indicated his/her understanding and acceptance.     Plan Discussed with: CRNA and Surgeon  Anesthesia Plan Comments:         Anesthesia Quick Evaluation

## 2014-05-27 NOTE — Anesthesia Postprocedure Evaluation (Signed)
  Anesthesia Post-op Note  Patient: Cassandra Malone  Procedure(s) Performed: Procedure(s): KNEE ARTHROSCOPY WITH  PARTIAL MEDIAL MENISECTOMY, SYNOVECTOMY LEFT (Left)  Anesthesia type:General  Patient location: PACU  Post pain: Pain level controlled  Post assessment: Post-op Vital signs reviewed, Patient's Cardiovascular Status Stable, Respiratory Function Stable, Patent Airway and No signs of Nausea or vomiting  Post vital signs: Reviewed and stable  Last Vitals:  Filed Vitals:   05/27/14 1139  BP: 114/75  Pulse:   Temp: 36.8 C  Resp:     Level of consciousness: awake, alert  and patient cooperative  Complications: No apparent anesthesia complications

## 2014-05-27 NOTE — Transfer of Care (Signed)
Immediate Anesthesia Transfer of Care Note  Patient: Cassandra Malone  Procedure(s) Performed: Procedure(s): KNEE ARTHROSCOPY WITH  PARTIAL MEDIAL MENISECTOMY, SYNOVECTOMY LEFT (Left)  Patient Location: PACU  Anesthesia Type:General  Level of Consciousness: sedated  Airway & Oxygen Therapy: Patient Spontanous Breathing and Patient connected to face mask oxygen  Post-op Assessment: Report given to RN and Post -op Vital signs reviewed and stable  Post vital signs: Reviewed and stable  Last Vitals:  Filed Vitals:   05/27/14 1001  BP: 131/83  Pulse: 58  Temp: 36.5 C  Resp: 18    Complications: No apparent anesthesia complications

## 2014-05-27 NOTE — Op Note (Signed)
05/27/2014  11:43 AM  PATIENT:  Cassandra Malone  59 y.o. female  PRE-OPERATIVE DIAGNOSIS:  complex medial meniscus tear  POST-OPERATIVE DIAGNOSIS:  medial mensical tear and synovitis  PROCEDURE:  Procedure(s): KNEE ARTHROSCOPY WITH  PARTIAL MEDIAL MENISECTOMY, SYNOVECTOMY LEFT (Left)  SURGEON: Leitha SchullerMichael J Shanigua Gibb, MD  ASSISTANTS: None  ANESTHESIA:   general  EBL:  Total I/O In: 1200 [I.V.:1200] Out: 10 [Blood:10]  BLOOD ADMINISTERED:none  DRAINS: none   LOCAL MEDICATIONS USED:  MARCAINE     SPECIMEN:  No Specimen  DISPOSITION OF SPECIMEN:  N/A  COUNTS:  YES  TOURNIQUET:   none  IMPLANTS: None  DICTATION: .Dragon Dictation patient brought the operating room and after adequate anesthesia was obtained the left leg was placed SA Holder was prepped and draped in usual fashion patient of Dr. patient medication timeout procedures were completed, an inferolateral portal was made and the arthroscope was introduced. Navicular report is made for probing and instrumentation initial inspection revealed moderate patellofemoral degenerative change with extensive synovitis in the suprapatellar pouch. Immediately there is fissuring and significant partial cartilage loss to the entire femoral condyle and tibial condyle. The medial meniscus had a tear posteriorly involving most of the posterior third meniscus itself was quite brittle and CL was intact and lateral compartment was relatively normal with minimal chondromalacia. A arthroscopic shaver was used to debride the suprapatellar area of synovitis. Meniscal punch are secure wand and shaver is used to debride the posterior third of the medial meniscus. Pre-and postprocedure pictures were obtained. The wounds were infiltrated with 20 cc half percent Sensorcaine with epinephrine, covered with Xeroform 4 x 4's web roll and Ace wrap and patient center comes stable condition  PLAN OF CARE: Discharge to home after PACU  PATIENT DISPOSITION:  PACU -  hemodynamically stable.

## 2014-05-27 NOTE — H&P (Signed)
Reviewed paper H+P, will be scanned into chart. No changes noted. Lungs: clear to ausculation Heart: RRR

## 2014-05-27 NOTE — Brief Op Note (Signed)
05/27/2014  11:42 AM  PATIENT:  Cassandra Malone  59 y.o. female  PRE-OPERATIVE DIAGNOSIS:  complex medial meniscus tear  POST-OPERATIVE DIAGNOSIS:  medial mensical tear and synovitis  PROCEDURE:  Procedure(s): KNEE ARTHROSCOPY WITH  PARTIAL MEDIAL MENISECTOMY, SYNOVECTOMY LEFT (Left)  SURGEON:  Surgeon(s) and Role:    * Kennedy BuckerMichael Anjel Pardo, MD - Primary  PHYSICIAN ASSISTANT:   ASSISTANTS: none   ANESTHESIA:   general  EBL:  Total I/O In: 1200 [I.V.:1200] Out: 10 [Blood:10]  BLOOD ADMINISTERED:none  DRAINS: none   LOCAL MEDICATIONS USED:  MARCAINE     SPECIMEN:  No Specimen  DISPOSITION OF SPECIMEN:  N/A  COUNTS:  YES  TOURNIQUET:  none  DICTATION: .Dragon Dictation  PLAN OF CARE: Discharge to home after PACU  PATIENT DISPOSITION:  PACU - hemodynamically stable.   Delay start of Pharmacological VTE agent (>24hrs) due to surgical blood loss or risk of bleeding: not applicable

## 2014-05-27 NOTE — Discharge Instructions (Addendum)
Keep leg elevated. Keep dressing clean and dry Aspirin 81 or 325 mg daily until walking normally Ibuprofen 400 mg on arrival home No extra tylenol with rx pain medication

## 2014-06-01 ENCOUNTER — Encounter: Payer: Self-pay | Admitting: Orthopedic Surgery

## 2015-04-25 ENCOUNTER — Telehealth: Payer: Self-pay | Admitting: Internal Medicine

## 2015-04-25 NOTE — Telephone Encounter (Signed)
Ok. Thank you.

## 2015-04-25 NOTE — Telephone Encounter (Signed)
Pt called about just wanting some advise about what to do pt states she having nausea/head stuffy/sinus headache. Pt does not want to come in. I advised pt that she may need to be seen. Call pt @ [phone]. Thank you!

## 2015-04-25 NOTE — Telephone Encounter (Signed)
Spoke with the patient.  Clarified with her what her symptoms were, typical cough congestion, clear nasal drainage with slight headache for the past week.  She doesn't want to come in for an appt just wants to try OTC meds.  Advised she can use Mucinex, flonase and tylenol, but if her symptoms do not resolved that she will need to make an appt. She verbalized understanding.

## 2015-05-26 ENCOUNTER — Other Ambulatory Visit: Payer: Self-pay | Admitting: Physician Assistant

## 2015-05-26 DIAGNOSIS — M2392 Unspecified internal derangement of left knee: Secondary | ICD-10-CM

## 2015-06-16 ENCOUNTER — Ambulatory Visit
Admission: RE | Admit: 2015-06-16 | Discharge: 2015-06-16 | Disposition: A | Discharge status: Home / Self Care | Payer: No Typology Code available for payment source | Source: Ambulatory Visit | Attending: Physician Assistant | Admitting: Physician Assistant

## 2015-06-16 DIAGNOSIS — S8002XA Contusion of left knee, initial encounter: Secondary | ICD-10-CM | POA: Diagnosis not present

## 2015-06-16 DIAGNOSIS — X58XXXA Exposure to other specified factors, initial encounter: Secondary | ICD-10-CM | POA: Diagnosis not present

## 2015-06-16 DIAGNOSIS — M2392 Unspecified internal derangement of left knee: Secondary | ICD-10-CM | POA: Diagnosis present

## 2015-06-16 DIAGNOSIS — S83242A Other tear of medial meniscus, current injury, left knee, initial encounter: Secondary | ICD-10-CM | POA: Diagnosis not present

## 2015-08-15 ENCOUNTER — Encounter
Admission: RE | Admit: 2015-08-15 | Discharge: 2015-08-15 | Disposition: A | Discharge status: Home / Self Care | Payer: PRIVATE HEALTH INSURANCE | Source: Ambulatory Visit | Attending: Orthopedic Surgery | Admitting: Orthopedic Surgery

## 2015-08-15 HISTORY — DX: Other complications of anesthesia, initial encounter: T88.59XA

## 2015-08-15 HISTORY — DX: Adverse effect of unspecified anesthetic, initial encounter: T41.45XA

## 2015-08-15 HISTORY — DX: Nausea with vomiting, unspecified: R11.2

## 2015-08-15 HISTORY — DX: Other specified postprocedural states: Z98.890

## 2015-08-15 NOTE — Patient Instructions (Signed)
  Your procedure is scheduled on: 08-24-15 Report to Same Day Surgery 2nd floor medical mall To find out your arrival time please call [phone] between 1PM - 3PM on 8-17  Remember: Instructions that are not followed completely may result in serious medical risk, up to and including death, or upon the discretion of your surgeon and anesthesiologist your surgery may need to be rescheduled.    _x___ 1. Do not eat food or drink liquids after midnight. No gum chewing or hard candies.     __x__ 2. No Alcohol for 24 hours before or after surgery.   __x__3. No Smoking for 24 prior to surgery.   ____  4. Bring all medications with you on the day of surgery if instructed.    __x__ 5. Notify your doctor if there is any change in your medical condition     (cold, fever, infections).     Do not wear jewelry, make-up, hairpins, clips or nail polish.  Do not wear lotions, powders, or perfumes. You may wear deodorant.  Do not shave 48 hours prior to surgery. Men may shave face and neck.  Do not bring valuables to the hospital.    [facility] is not responsible for any belongings or valuables.               Contacts, dentures or bridgework may not be worn into surgery.  Leave your suitcase in the car. After surgery it may be brought to your room.  For patients admitted to the hospital, discharge time is determined by your treatment team.   Patients discharged the day of surgery will not be allowed to drive home.    Please read over the following fact sheets that you were given:   [facility] Preparing for Surgery and or MRSA Information   ____ Take these medicines the morning of surgery with A SIP OF WATER:    1. NONE  2.  3.  4.  5.  6.  ____ Fleet Enema (as directed)   ____ Use CHG Soap or sage wipes as directed on instruction sheet   ____ Use inhalers on the day of surgery and bring to hospital day of surgery  ____ Stop metformin 2 days prior to surgery    ____ Take 1/2 of  usual insulin dose the night before surgery and none on the morning of surgery.   ____ Stop aspirin or coumadin, or plavix  _x__ Stop Anti-inflammatories such as Advil, Aleve, Ibuprofen, Motrin, Naproxen,          Naprosyn, Goodies powders or aspirin products. Ok to take Tylenol.   __X__ Stop supplements until after surgery-STOP ALL SUPPLEMENTS EXCEPT MVI, CALCIUM, MAGNESIUM AND ZINC  ____ Bring C-Pap to the hospital.

## 2015-08-24 ENCOUNTER — Ambulatory Visit: Payer: No Typology Code available for payment source | Admitting: Certified Registered Nurse Anesthetist

## 2015-08-24 ENCOUNTER — Ambulatory Visit
Admission: RE | Admit: 2015-08-24 | Discharge: 2015-08-24 | Disposition: A | Discharge status: Home / Self Care | Payer: No Typology Code available for payment source | Source: Ambulatory Visit | Attending: Orthopedic Surgery | Admitting: Orthopedic Surgery

## 2015-08-24 ENCOUNTER — Encounter
Admission: RE | Disposition: A | Discharge status: Home / Self Care | Payer: Self-pay | Source: Ambulatory Visit | Attending: Orthopedic Surgery

## 2015-08-24 DIAGNOSIS — M2392 Unspecified internal derangement of left knee: Secondary | ICD-10-CM | POA: Diagnosis present

## 2015-08-24 DIAGNOSIS — M94262 Chondromalacia, left knee: Secondary | ICD-10-CM | POA: Insufficient documentation

## 2015-08-24 DIAGNOSIS — Z91040 Latex allergy status: Secondary | ICD-10-CM | POA: Insufficient documentation

## 2015-08-24 DIAGNOSIS — M23242 Derangement of anterior horn of lateral meniscus due to old tear or injury, left knee: Secondary | ICD-10-CM | POA: Diagnosis not present

## 2015-08-24 DIAGNOSIS — Z79899 Other long term (current) drug therapy: Secondary | ICD-10-CM | POA: Diagnosis not present

## 2015-08-24 DIAGNOSIS — M1990 Unspecified osteoarthritis, unspecified site: Secondary | ICD-10-CM | POA: Insufficient documentation

## 2015-08-24 DIAGNOSIS — M23222 Derangement of posterior horn of medial meniscus due to old tear or injury, left knee: Secondary | ICD-10-CM | POA: Diagnosis not present

## 2015-08-24 HISTORY — PX: KNEE ARTHROSCOPY WITH MEDIAL MENISECTOMY: SHX5651

## 2015-08-24 HISTORY — PX: CHONDROPLASTY: SHX5177

## 2015-08-24 HISTORY — PX: KNEE ARTHROSCOPY WITH LATERAL MENISECTOMY: SHX6193

## 2015-08-24 SURGERY — CHONDROPLASTY
Anesthesia: General | Site: Knee | Laterality: Left | Wound class: Clean

## 2015-08-24 MED ORDER — METOCLOPRAMIDE HCL 5 MG/ML IJ SOLN
5.0000 mg | Freq: Three times a day (TID) | INTRAMUSCULAR | Status: DC | PRN
Start: 2015-08-24 — End: 2015-08-24

## 2015-08-24 MED ORDER — BUPIVACAINE-EPINEPHRINE (PF) 0.25% -1:200000 IJ SOLN
INTRAMUSCULAR | Status: AC
  Filled 2015-08-24: qty 30

## 2015-08-24 MED ORDER — PROPOFOL 10 MG/ML IV BOLUS
INTRAVENOUS | Status: DC | PRN
  Administered 2015-08-24: 150 mg via INTRAVENOUS
  Administered 2015-08-24: 50 mg via INTRAVENOUS

## 2015-08-24 MED ORDER — GLYCOPYRROLATE 0.2 MG/ML IJ SOLN
INTRAMUSCULAR | Status: DC | PRN
Start: 2015-08-24 — End: 2015-08-24
  Administered 2015-08-24: 0.2 mg via INTRAVENOUS

## 2015-08-24 MED ORDER — LACTATED RINGERS IV SOLN
INTRAVENOUS | Status: DC
  Administered 2015-08-24: 15:00:00 via INTRAVENOUS

## 2015-08-24 MED ORDER — ONDANSETRON HCL 4 MG/2ML IJ SOLN: 4.0000 mg | Freq: Four times a day (QID) | INTRAMUSCULAR | Status: DC | PRN

## 2015-08-24 MED ORDER — FAMOTIDINE 20 MG PO TABS: 20.0000 mg | ORAL_TABLET | Freq: Once | ORAL | Status: DC

## 2015-08-24 MED ORDER — ACETAMINOPHEN 10 MG/ML IV SOLN
INTRAVENOUS | Status: DC | PRN
  Administered 2015-08-24: 1000 mg via INTRAVENOUS

## 2015-08-24 MED ORDER — ACETAMINOPHEN 10 MG/ML IV SOLN
INTRAVENOUS | Status: AC
  Filled 2015-08-24: qty 100

## 2015-08-24 MED ORDER — FENTANYL CITRATE (PF) 100 MCG/2ML IJ SOLN
INTRAMUSCULAR | Status: DC | PRN
  Administered 2015-08-24 (×4): 25 ug via INTRAVENOUS

## 2015-08-24 MED ORDER — SODIUM CHLORIDE 0.9 % IJ SOLN
INTRAMUSCULAR | Status: AC
  Filled 2015-08-24: qty 10

## 2015-08-24 MED ORDER — MORPHINE SULFATE (PF) 4 MG/ML IV SOLN
INTRAVENOUS | Status: DC | PRN
  Administered 2015-08-24: 4 mg

## 2015-08-24 MED ORDER — ONDANSETRON HCL 4 MG/2ML IJ SOLN
INTRAMUSCULAR | Status: DC | PRN
  Administered 2015-08-24: 4 mg via INTRAVENOUS

## 2015-08-24 MED ORDER — SODIUM CHLORIDE 0.9 % IV SOLN: INTRAVENOUS | Status: DC

## 2015-08-24 MED ORDER — PROMETHAZINE HCL 25 MG/ML IJ SOLN
INTRAMUSCULAR | Status: AC
  Filled 2015-08-24: qty 1

## 2015-08-24 MED ORDER — MORPHINE SULFATE (PF) 4 MG/ML IV SOLN
INTRAVENOUS | Status: AC
Start: 2015-08-24 — End: 2015-08-24
  Filled 2015-08-24: qty 1

## 2015-08-24 MED ORDER — HYDROCODONE-ACETAMINOPHEN 5-325 MG PO TABS: 1.0000 | ORAL_TABLET | ORAL | 0 refills | Status: DC | PRN

## 2015-08-24 MED ORDER — DEXAMETHASONE SODIUM PHOSPHATE 10 MG/ML IJ SOLN
INTRAMUSCULAR | Status: DC | PRN
  Administered 2015-08-24: 10 mg via INTRAVENOUS

## 2015-08-24 MED ORDER — BUPIVACAINE-EPINEPHRINE 0.25% -1:200000 IJ SOLN
INTRAMUSCULAR | Status: DC | PRN
  Administered 2015-08-24: 25 mL
  Administered 2015-08-24: 5 mL

## 2015-08-24 MED ORDER — CHLORHEXIDINE GLUCONATE 4 % EX LIQD: 60.0000 mL | Freq: Once | CUTANEOUS | Status: DC

## 2015-08-24 MED ORDER — FENTANYL CITRATE (PF) 100 MCG/2ML IJ SOLN: 25.0000 ug | INTRAMUSCULAR | Status: DC | PRN

## 2015-08-24 MED ORDER — HYDROCODONE-ACETAMINOPHEN 5-325 MG PO TABS: 1.0000 | ORAL_TABLET | ORAL | Status: DC | PRN

## 2015-08-24 MED ORDER — METOCLOPRAMIDE HCL 10 MG PO TABS
5.0000 mg | ORAL_TABLET | Freq: Three times a day (TID) | ORAL | Status: DC | PRN
Start: 2015-08-24 — End: 2015-08-24

## 2015-08-24 MED ORDER — PROMETHAZINE HCL 25 MG/ML IJ SOLN
12.5000 mg | Freq: Once | INTRAMUSCULAR | Status: AC
  Administered 2015-08-24: 12.5 mg via INTRAVENOUS

## 2015-08-24 MED ORDER — ONDANSETRON HCL 4 MG PO TABS: 4.0000 mg | ORAL_TABLET | Freq: Four times a day (QID) | ORAL | Status: DC | PRN

## 2015-08-24 MED ORDER — ONDANSETRON HCL 4 MG/2ML IJ SOLN: 4.0000 mg | Freq: Once | INTRAMUSCULAR | Status: DC | PRN

## 2015-08-24 MED ORDER — LIDOCAINE HCL (CARDIAC) 20 MG/ML IV SOLN
INTRAVENOUS | Status: DC | PRN
  Administered 2015-08-24: 50 mg via INTRAVENOUS

## 2015-08-24 MED ORDER — MIDAZOLAM HCL 2 MG/2ML IJ SOLN
INTRAMUSCULAR | Status: DC | PRN
Start: 2015-08-24 — End: 2015-08-24
  Administered 2015-08-24: 2 mg via INTRAVENOUS

## 2015-08-24 MED ORDER — FAMOTIDINE 20 MG PO TABS
ORAL_TABLET | ORAL | Status: AC
  Filled 2015-08-24: qty 1

## 2015-08-24 SURGICAL SUPPLY — 23 items
BLADE SHAVER 4.5 DBL SERAT CV (CUTTER) ×3 IMPLANT
BNDG ESMARK 6X12 TAN STRL LF (GAUZE/BANDAGES/DRESSINGS) ×3 IMPLANT
CUFF TOURN 24 STER (MISCELLANEOUS) ×3 IMPLANT
CUFF TOURN 30 STER DUAL PORT (MISCELLANEOUS) IMPLANT
DRSG DERMACEA 8X12 NADH (GAUZE/BANDAGES/DRESSINGS) ×3 IMPLANT
DURAPREP 26ML APPLICATOR (WOUND CARE) ×6 IMPLANT
GAUZE SPONGE 4X4 12PLY STRL (GAUZE/BANDAGES/DRESSINGS) ×3 IMPLANT
GLOVE BIOGEL M STRL SZ7.5 (GLOVE) ×3 IMPLANT
GLOVE INDICATOR 8.0 STRL GRN (GLOVE) ×3 IMPLANT
GOWN STRL REUS W/ TWL LRG LVL3 (GOWN DISPOSABLE) ×4 IMPLANT
GOWN STRL REUS W/TWL LRG LVL3 (GOWN DISPOSABLE) ×2
IV LACTATED RINGER IRRG 3000ML (IV SOLUTION) ×6
IV LR IRRIG 3000ML ARTHROMATIC (IV SOLUTION) ×12 IMPLANT
KIT RM TURNOVER STRD PROC AR (KITS) ×3 IMPLANT
MANIFOLD NEPTUNE II (INSTRUMENTS) ×3 IMPLANT
PACK ARTHROSCOPY KNEE (MISCELLANEOUS) ×3 IMPLANT
SET TUBE SUCT SHAVER OUTFL 24K (TUBING) ×3 IMPLANT
SET TUBE TIP INTRA-ARTICULAR (MISCELLANEOUS) ×3 IMPLANT
SUT ETHILON 3-0 FS-10 30 BLK (SUTURE) ×3
SUTURE EHLN 3-0 FS-10 30 BLK (SUTURE) ×2 IMPLANT
TUBING ARTHRO INFLOW-ONLY STRL (TUBING) ×3 IMPLANT
WAND HAND CNTRL MULTIVAC 50 (MISCELLANEOUS) ×3 IMPLANT
WRAP KNEE W/COLD PACKS 25.5X14 (SOFTGOODS) ×3 IMPLANT

## 2015-08-24 NOTE — Anesthesia Preprocedure Evaluation (Signed)
Anesthesia Evaluation  Patient identified by MRN, date of birth, ID band Patient awake    Reviewed: Allergy & Precautions, NPO status , Patient's Chart, lab work & pertinent test results, reviewed documented beta blocker date and time   History of Anesthesia Complications (+) PONV and history of anesthetic complications  Airway Mallampati: II  TM Distance: >3 FB     Dental  (+) Chipped   Pulmonary           Cardiovascular      Neuro/Psych    GI/Hepatic   Endo/Other    Renal/GU      Musculoskeletal  (+) Arthritis ,   Abdominal   Peds  Hematology   Anesthesia Other Findings   Reproductive/Obstetrics                             Anesthesia Physical Anesthesia Plan  ASA: II  Anesthesia Plan: General   Post-op Pain Management:    Induction: Intravenous  Airway Management Planned: LMA  Additional Equipment:   Intra-op Plan:   Post-operative Plan:   Informed Consent: I have reviewed the patients History and Physical, chart, labs and discussed the procedure including the risks, benefits and alternatives for the proposed anesthesia with the patient or authorized representative who has indicated his/her understanding and acceptance.     Plan Discussed with: CRNA  Anesthesia Plan Comments:         Anesthesia Quick Evaluation

## 2015-08-24 NOTE — Discharge Instructions (Signed)
AMBULATORY SURGERY  °DISCHARGE INSTRUCTIONS ° ° °1) The drugs that you were given will stay in your system until tomorrow so for the next 24 hours you should not: ° °A) Drive an automobile °B) Make any legal decisions °C) Drink any alcoholic beverage ° ° °2) You may resume regular meals tomorrow.  Today it is better to start with liquids and gradually work up to solid foods. ° °You may eat anything you prefer, but it is better to start with liquids, then soup and crackers, and gradually work up to solid foods. ° ° °3) Please notify your doctor immediately if you have any unusual bleeding, trouble breathing, redness and pain at the surgery site, drainage, fever, or pain not relieved by medication. °4)  ° °5) Your post-operative visit with Dr.                     °           °     is: Date:                        Time:   ° °Please call to schedule your post-operative visit. ° °6) Additional Instructions: ° ° ° ° ° ° ° °Instructions after Knee Arthroscopy  ° ° James P. Hooten, Jr., M.D.    ° Dept. of Orthopaedics & Sports Medicine ° [facility] ° [address] ° [place], [place]  [address] ° ° Phone: [phone]   Fax: [phone] ° ° °DIET: °• Drink plenty of non-alcoholic fluids & begin a light diet. °• Resume your normal diet the day after surgery. ° °ACTIVITY:  °• You may use crutches or a walker with weight-bearing as tolerated, unless instructed otherwise. °• You may wean yourself off of the walker or crutches as tolerated.  °• Begin doing gentle exercises. Exercising will reduce the pain and swelling, increase motion, and prevent muscle weakness.   °• Avoid strenuous activities or athletics for a minimum of 4-6 weeks after arthroscopic surgery. °• Do not drive or operate any equipment until instructed. ° °WOUND CARE:  °• Place one to two pillows under the knee the first day or two when sitting or lying.  °• Continue to use the ice packs periodically to reduce pain and swelling. °• The small incisions in  your knee are closed with nylon stitches. The stitches will be removed in the office. °• The bulky dressing may be removed on the second day after surgery. DO NOT TOUCH THE STITCHES. Put a Band-Aid over each stitch. Do NOT use any ointments or creams on the incisions.  °• You may bathe or shower after the stitches are removed at the first office visit following surgery. ° °MEDICATIONS: °• You may resume your regular medications. °• Please take the pain medication as prescribed. °• Do not take pain medication on an empty stomach. °• Do not drive or drink alcoholic beverages when taking pain medications. ° °CALL THE OFFICE FOR: °• Temperature above 101 degrees °• Excessive bleeding or drainage on the dressing. °• Excessive swelling, coldness, or paleness of the toes. °• Persistent nausea and vomiting. ° °FOLLOW-UP:  °• You should have an appointment to return to the office in 7-10 days after surgery.  °  °

## 2015-08-24 NOTE — Anesthesia Procedure Notes (Signed)
Procedure Name: LMA Insertion Date/Time: 08/24/2015 4:33 PM Performed by: Ginger Carne Pre-anesthesia Checklist: Patient identified, Emergency Drugs available, Suction available, Patient being monitored and Timeout performed Patient Re-evaluated:Patient Re-evaluated prior to inductionOxygen Delivery Method: Circle system utilized Preoxygenation: Pre-oxygenation with 100% oxygen Intubation Type: IV induction Ventilation: Mask ventilation without difficulty LMA: LMA inserted LMA Size: 3.5 Grade View: Grade I Tube type: Oral Number of attempts: 1 Placement Confirmation: ETT inserted through vocal cords under direct vision,  positive ETCO2 and breath sounds checked- equal and bilateral Tube secured with: Tape Dental Injury: Teeth and Oropharynx as per pre-operative assessment

## 2015-08-24 NOTE — Op Note (Signed)
OPERATIVE NOTE  DATE OF SURGERY:  08/24/2015  PATIENT NAME:  Cassandra Malone   DOB: 01-02-1956  MRN: [id number]   PRE-OPERATIVE DIAGNOSIS:  Internal derangement of the left knee   POST-OPERATIVE DIAGNOSIS:   Radial tear of the posterior horn of the medial meniscus, left knee Tear of the anterior horn of the lateral meniscus, left knee Grade 3 chondromalacia involving the medial, lateral, and patellofemoral compartments of the left knee  PROCEDURE:  Left knee arthroscopy, partial medial and lateral meniscectomies, and chondroplasty  SURGEON:  Jena Gauss., M.D.   ASSISTANT: none  ANESTHESIA: general  ESTIMATED BLOOD LOSS: Minimal  FLUIDS REPLACED: 1000 mL of crystalloid  TOURNIQUET TIME: Not used   DRAINS: none  IMPLANTS UTILIZED: None  INDICATIONS FOR SURGERY: Cassandra Malone is a 60 y.o. year old female who has been seen for complaints of left knee pain. MRI demonstrated findings consistent with meniscal pathology. After discussion of the risks and benefits of surgical intervention, the patient expressed understanding of the risks benefits and agree with plans for left knee arthroscopy.   PROCEDURE IN DETAIL: The patient was brought into the operating room and, after adequate general anesthesia was achieved, a tourniquet was applied to the left thigh and the leg was placed in the leg holder. All bony prominences were well padded. The patient's left knee was cleaned and prepped with alcohol and Duraprep and draped in the usual sterile fashion. A "timeout" was performed as per usual protocol. The anticipated portal sites were injected with 0.25% Marcaine with epinephrine. An anterolateral incision was made and a cannula was inserted. A small effusion was evacuated and the knee was distended with fluid using the pump. The scope was advanced down the medial gutter into the medial compartment. Under visualization with the scope, an anteromedial portal was created and a hooked probe  was inserted. The medial meniscus was visualized and probed. There was a radial tear of the posterior horn of the medial meniscus. The tear was debrided using meniscal punches and a 4.5 mm shaver. Final contouring was performed using a 50 ArthroCare wand. Remaining rim of meniscus was visualized and probed and felt to stable. The articular cartilage was visualized. Grade 3 changes of chondromalacia were noted primarily to the medial femoral condyle. These areas were debrided and contoured using the ArthroCare wand.  The scope was then advanced into the intercondylar notch. The anterior cruciate ligament was visualized and probed and felt to be intact. The scope was removed from the lateral portal and reinserted via the anteromedial portal to better visualize the lateral compartment. The lateral meniscus was visualized and probed. There was a complex degenerative tear of the anterior horn of the lateral meniscus. The tear was debrided using the 4.5 mm incisor shaver and then contoured using the 50 ArthroCare wand. Remaining rim of meniscus was visualized and probed and stable. The articular cartilage of the lateral compartment was visualized. There were grade 2-3 changes of chondromalacia involving the lateral compartment and these areas were debrided using the ArthroCare wand. Finally, the scope was advanced so as to visualize the patellofemoral articulation. Good patellar tracking was appreciated. Grade 2-3 changes of chondromalacia were noted primarily along the facets of the patella. These areas were debrided and contoured using ArthroCare wand.  The knee was irrigated with copius amounts of fluid and suctioned dry. The anterolateral portal was re-approximated with #3-0 nylon. A combination of 0.25% Marcaine with epinephrine and 4 mg of Morphine were injected via the  scope. The scope was removed and the anteromedial portal was re-approximated with #3-0 nylon. A sterile dressing was applied followed by  application of an ice wrap.  The patient tolerated the procedure well and was transported to the PACU in stable condition.  Cassandra Malone., M.D.

## 2015-08-24 NOTE — OR Nursing (Signed)
Patient refused pepcide

## 2015-08-24 NOTE — Anesthesia Postprocedure Evaluation (Signed)
Anesthesia Post Note  Patient: Cassandra Malone  Procedure(s) Performed: Procedure(s) (LRB): CHONDROPLASTY (Left) KNEE ARTHROSCOPY WITH MEDIAL MENISECTOMY (Left) KNEE ARTHROSCOPY WITH LATERAL MENISECTOMY (Left)  Patient location during evaluation: PACU Anesthesia Type: General Level of consciousness: awake and alert Pain management: pain level controlled Vital Signs Assessment: post-procedure vital signs reviewed and stable Respiratory status: spontaneous breathing, nonlabored ventilation, respiratory function stable and patient connected to nasal cannula oxygen Cardiovascular status: blood pressure returned to baseline and stable Postop Assessment: no signs of nausea or vomiting Anesthetic complications: no    Last Vitals:  Vitals:   08/24/15 1922 08/24/15 1951  BP: (!) 143/77 137/79  Pulse: (!) 59 (!) 47  Resp: 14 14  Temp: 36.8 C 36.8 C    Last Pain:  Vitals:   08/24/15 1951  TempSrc: Oral  PainSc:                  Cleda Mccreedy Navika Hoopes

## 2015-08-24 NOTE — Transfer of Care (Signed)
Immediate Anesthesia Transfer of Care Note  Patient: Cassandra Malone  Procedure(s) Performed: Procedure(s): CHONDROPLASTY (Left) KNEE ARTHROSCOPY WITH MEDIAL MENISECTOMY (Left) KNEE ARTHROSCOPY WITH LATERAL MENISECTOMY (Left)  Patient Location: PACU  Anesthesia Type:General  Level of Consciousness: awake, alert  and patient cooperative  Airway & Oxygen Therapy: Patient Spontanous Breathing  Post-op Assessment: Report given to RN and Post -op Vital signs reviewed and stable  Post vital signs: Reviewed and stable  Last Vitals:  Vitals:   08/24/15 1413 08/24/15 1819  BP: 121/75 (P) 132/65  Pulse: 67   Resp: 16   Temp: 36.4 C (P) 36.4 C    Last Pain:  Vitals:   08/24/15 1413  TempSrc: Tympanic         Complications: No apparent anesthesia complications

## 2015-08-24 NOTE — Brief Op Note (Signed)
08/24/2015  6:26 PM  PATIENT:  Cassandra Malone  60 y.o. female  PRE-OPERATIVE DIAGNOSIS:  INTERNAL DERANGEMENT OF LEFT KNEE  POST-OPERATIVE DIAGNOSIS:  tear posterior horn medial meniscus, grade III chondromalacia medial & lateral compartments, tear anterior horn lateral meniscus, chondromalacia patella   PROCEDURE:  Procedure(s): CHONDROPLASTY (Left) KNEE ARTHROSCOPY WITH MEDIAL MENISECTOMY (Left) KNEE ARTHROSCOPY WITH LATERAL MENISECTOMY (Left)  SURGEON:  Surgeon(s) and Role:    * Donato Heinz, MD - Primary  ASSISTANTS: none   ANESTHESIA:   general  EBL:  Total I/O In: 1000 [I.V.:1000] Out: -   BLOOD ADMINISTERED:none  DRAINS: none   LOCAL MEDICATIONS USED:  MARCAINE     SPECIMEN:  No Specimen  DISPOSITION OF SPECIMEN:  N/A  COUNTS:  YES  TOURNIQUET:  not used  DICTATION: .Office manager  PLAN OF CARE: Discharge to home after PACU  PATIENT DISPOSITION:  PACU - hemodynamically stable.   Delay start of Pharmacological VTE agent (>24hrs) due to surgical blood loss or risk of bleeding: not applicable

## 2015-08-24 NOTE — OR Nursing (Signed)
Instructed patient on use of incentive spirometer, return demonstration

## 2015-08-24 NOTE — H&P (Signed)
The patient has been re-examined, and the chart reviewed, and there have been no interval changes to the documented history and physical.    The risks, benefits, and alternatives have been discussed at length. The patient expressed understanding of the risks benefits and agreed with plans for surgical intervention.  Javaun Dimperio P. Burnis Kaser, Jr. M.D.    

## 2015-08-25 ENCOUNTER — Encounter: Payer: Self-pay | Admitting: Orthopedic Surgery

## 2016-07-24 ENCOUNTER — Encounter: Payer: Self-pay | Admitting: Family Medicine

## 2016-07-24 ENCOUNTER — Ambulatory Visit (INDEPENDENT_AMBULATORY_CARE_PROVIDER_SITE_OTHER): Payer: PRIVATE HEALTH INSURANCE | Admitting: Family Medicine

## 2016-07-24 VITALS — BP 120/80 | HR 71 | Temp 98.5°F | Ht 65.0 in | Wt 134.4 lb

## 2016-07-24 DIAGNOSIS — N390 Urinary tract infection, site not specified: Secondary | ICD-10-CM

## 2016-07-24 DIAGNOSIS — Z1231 Encounter for screening mammogram for malignant neoplasm of breast: Secondary | ICD-10-CM | POA: Diagnosis not present

## 2016-07-24 DIAGNOSIS — Z1239 Encounter for other screening for malignant neoplasm of breast: Secondary | ICD-10-CM

## 2016-07-24 NOTE — Patient Instructions (Signed)
Nice to see you. Please monitor for any recurrent urinary tract symptoms. If they do recur please come back and see us. We will get you set up for a physical exam to get a Pap smear done.

## 2016-07-24 NOTE — Progress Notes (Signed)
Cassandra AlarEric Jessi Pitstick, MD Phone: [phone]  Percival SpanishLouise A Malone is a 61 y.o. female who presents today for new patient visit.  Patient has a history of recurrent UTIs. [facility] Treated with antibiotics and notes no recurrent symptoms. Notes she typically gets urgency and frequency with low back and abdominal pain. Occasionally does have upper back and upper abdominal pain only with her UTIs. She saw a urologist who told her to start on cranberry pills. They also prescribed what sounds to be an estrogen cream. She's been using a over-the-counter natural estrogen cream. This had been beneficial for about a year. She stopped using it and the UTIs started to recur. She notes no urinary tract symptoms currently.  Active Ambulatory Problems    Diagnosis Date Noted  . Food allergy 04/23/2012  . Arthritis 04/23/2012  . Left ovarian cyst 11/20/2012  . Recurrent UTI 07/24/2016   Resolved Ambulatory Problems    Diagnosis Date Noted  . Screening for breast cancer 04/23/2012  . Routine general medical examination at a health care facility 04/23/2012  . Special screening for malignant neoplasms, colon 04/23/2012  . Polypharmacy 04/23/2012  . UTI (urinary tract infection) 10/02/2012  . Hematuria 10/09/2012  . Right flank pain 10/09/2012   Past Medical History:  Diagnosis Date  . Allergy   . Arthritis   . Complication of anesthesia   . Family history of adverse reaction to anesthesia   . PONV (postoperative nausea and vomiting)     Family History  Problem Relation Age of Onset  . Adopted: Yes    Social History   Social History  . Marital status: Married    Spouse name: N/A  . Number of children: N/A  . Years of education: N/A   Occupational History  . Not on file.   Social History Main Topics  . Smoking status: Never Smoker  . Smokeless tobacco: Never Used  . Alcohol use No  . Drug use: No  . Sexual activity: Not on file   Other Topics Concern  . Not on file    Social History Narrative   [place] in [place]. No children. Dog/cat.      Work - [employer]      Diet - mostly vegetarian diet x chicken      Exercise - 3 days per week at [employer], treadmill    ROS  General:  Negative for nexplained weight loss, fever Skin: Negative for new or changing mole, sore that won't heal HEENT: Negative for trouble hearing, trouble seeing, ringing in ears, mouth sores, hoarseness, change in voice, dysphagia. CV:  Negative for chest pain, dyspnea, edema, palpitations Resp: Negative for cough, dyspnea, hemoptysis GI: Negative for nausea, vomiting, diarrhea, constipation, abdominal pain, melena, hematochezia. GU: Negative for dysuria, incontinence, urinary hesitance, hematuria, vaginal or penile discharge, polyuria, sexual difficulty, lumps in testicle or breasts MSK: Negative for muscle cramps or aches, joint pain or swelling Neuro: Negative for headaches, weakness, numbness, dizziness, passing out/fainting Psych: Negative for depression, anxiety, memory problems  Objective  Physical Exam Vitals:   07/24/16 1427  BP: 120/80  Pulse: 71  Temp: 98.5 F (36.9 C)    BP Readings from Last 3 Encounters:  07/24/16 120/80  08/24/15 137/79  05/27/14 (!) 141/60   Wt Readings from Last 3 Encounters:  07/24/16 134 lb 6.4 oz (61 kg)  08/24/15 135 lb (61.2 kg)  08/15/15 135 lb (61.2 kg)    Physical Exam  Constitutional: No distress.  HENT:  Head: Normocephalic and atraumatic.  Mouth/Throat: Oropharynx is clear and moist. No oropharyngeal exudate.  Eyes: Conjunctivae are normal. Pupils are equal, round, and reactive to light.  Cardiovascular: Normal rate, regular rhythm and normal heart sounds.   Pulmonary/Chest: Effort normal and breath sounds normal.  Abdominal: Soft. Bowel sounds are normal. She exhibits no distension. There is no tenderness. There is no rebound and no guarding.  Musculoskeletal: She exhibits no edema.    Neurological: She is alert. Gait normal.  Skin: Skin is warm and dry. She is not diaphoretic.  Psychiatric: Mood and affect normal.     Assessment/Plan:   Recurrent UTI Patient with history of recurrent UTIs. Currently with no symptoms. Discussed continuing topical cream. Continue cranberry pills. Monitor for any recurrence. She'll contact us if they do recur.   Orders Placed This Encounter  Procedures  . MM Digital Screening    Standing Status:   Future    Standing Expiration Date:   09/24/2017    Order Specific Question:   Reason for Exam (SYMPTOM  OR DIAGNOSIS REQUIRED)    Answer:   screening mammo    Order Specific Question:   Preferred imaging location?    Answer:   [facility]    No orders of the defined types were placed in this encounter.  Patient will return for complete physical exam at her convenience in the next several weeks to months. She is due for colon cancer screening and Pap smear. She reports she is given after wrap her head around doing another Pap smear. She will consider cologuard and if she decides to do that she will check with her insurance company. Mammogram was ordered.  Cassandra Alar, MD [facility]

## 2016-07-24 NOTE — Assessment & Plan Note (Signed)
Patient with history of recurrent UTIs. Currently with no symptoms. Discussed continuing topical cream. Continue cranberry pills. Monitor for any recurrence. She'll contact us if they do recur.

## 2016-09-06 ENCOUNTER — Telehealth: Payer: Self-pay | Admitting: *Deleted

## 2016-09-06 NOTE — Telephone Encounter (Signed)
Please see if the patient has nausea, vomiting, abdominal pain, or fevers. I'm certainly happy to order lab work though if she's having symptoms she will need to be evaluated.

## 2016-09-06 NOTE — Telephone Encounter (Signed)
Patient is not having any symptoms, patient is scheduled for labs

## 2016-09-06 NOTE — Telephone Encounter (Signed)
Please advise 

## 2016-09-06 NOTE — Telephone Encounter (Signed)
Patient has requested orders for Hepatitis A labs to be drawn. Pt attended a wedding and was informed that a server had hepatitis A and was advised to receive an injection  Pt contact [phone]

## 2016-09-10 ENCOUNTER — Other Ambulatory Visit: Payer: PRIVATE HEALTH INSURANCE

## 2016-09-12 ENCOUNTER — Ambulatory Visit: Payer: PRIVATE HEALTH INSURANCE

## 2016-09-17 ENCOUNTER — Encounter: Payer: Self-pay | Admitting: Family Medicine

## 2016-09-25 ENCOUNTER — Ambulatory Visit
Admission: RE | Admit: 2016-09-25 | Discharge: 2016-09-25 | Disposition: A | Discharge status: Home / Self Care | Payer: PRIVATE HEALTH INSURANCE | Source: Ambulatory Visit | Attending: Family Medicine | Admitting: Family Medicine

## 2016-09-25 DIAGNOSIS — Z1231 Encounter for screening mammogram for malignant neoplasm of breast: Secondary | ICD-10-CM | POA: Diagnosis present

## 2016-09-25 DIAGNOSIS — Z1239 Encounter for other screening for malignant neoplasm of breast: Secondary | ICD-10-CM

## 2016-11-05 ENCOUNTER — Ambulatory Visit: Payer: Self-pay | Admitting: Obstetrics & Gynecology

## 2016-11-05 ENCOUNTER — Ambulatory Visit (HOSPITAL_COMMUNITY): Payer: No Typology Code available for payment source | Admitting: Anesthesiology

## 2016-11-05 ENCOUNTER — Encounter (HOSPITAL_COMMUNITY)
Admission: RE | Disposition: A | Discharge status: Home / Self Care | Payer: Self-pay | Source: Ambulatory Visit | Attending: Orthopedic Surgery

## 2016-11-05 ENCOUNTER — Encounter (HOSPITAL_COMMUNITY): Payer: Self-pay

## 2016-11-05 ENCOUNTER — Ambulatory Visit (HOSPITAL_COMMUNITY)
Admission: RE | Admit: 2016-11-05 | Discharge: 2016-11-05 | Disposition: A | Discharge status: Home / Self Care | Payer: No Typology Code available for payment source | Source: Ambulatory Visit | Attending: Orthopedic Surgery | Admitting: Orthopedic Surgery

## 2016-11-05 DIAGNOSIS — Y939 Activity, unspecified: Secondary | ICD-10-CM | POA: Diagnosis not present

## 2016-11-05 DIAGNOSIS — Z91040 Latex allergy status: Secondary | ICD-10-CM | POA: Diagnosis not present

## 2016-11-05 DIAGNOSIS — M1990 Unspecified osteoarthritis, unspecified site: Secondary | ICD-10-CM | POA: Diagnosis not present

## 2016-11-05 DIAGNOSIS — S52571A Other intraarticular fracture of lower end of right radius, initial encounter for closed fracture: Secondary | ICD-10-CM | POA: Insufficient documentation

## 2016-11-05 DIAGNOSIS — Z79899 Other long term (current) drug therapy: Secondary | ICD-10-CM | POA: Diagnosis not present

## 2016-11-05 DIAGNOSIS — Z91018 Allergy to other foods: Secondary | ICD-10-CM

## 2016-11-05 DIAGNOSIS — S52501A Unspecified fracture of the lower end of right radius, initial encounter for closed fracture: Secondary | ICD-10-CM | POA: Diagnosis present

## 2016-11-05 DIAGNOSIS — W19XXXA Unspecified fall, initial encounter: Secondary | ICD-10-CM | POA: Insufficient documentation

## 2016-11-05 HISTORY — PX: OPEN REDUCTION INTERNAL FIXATION (ORIF) DISTAL RADIAL FRACTURE: SHX5989

## 2016-11-05 SURGERY — OPEN REDUCTION INTERNAL FIXATION (ORIF) DISTAL RADIUS FRACTURE
Anesthesia: Regional | Site: Wrist | Laterality: Right

## 2016-11-05 MED ORDER — ROPIVACAINE HCL 7.5 MG/ML IJ SOLN
INTRAMUSCULAR | Status: DC | PRN
  Administered 2016-11-05: 30 mL via PERINEURAL

## 2016-11-05 MED ORDER — CEFAZOLIN SODIUM-DEXTROSE 2-4 GM/100ML-% IV SOLN
INTRAVENOUS | Status: AC
  Filled 2016-11-05: qty 100

## 2016-11-05 MED ORDER — CEFAZOLIN SODIUM-DEXTROSE 2-4 GM/100ML-% IV SOLN
2.0000 g | Freq: Once | INTRAVENOUS | Status: AC
  Administered 2016-11-05: 2 g via INTRAVENOUS

## 2016-11-05 MED ORDER — MEPERIDINE HCL 25 MG/ML IJ SOLN: 6.2500 mg | INTRAMUSCULAR | Status: DC | PRN

## 2016-11-05 MED ORDER — LIDOCAINE HCL (CARDIAC) 20 MG/ML IV SOLN
INTRAVENOUS | Status: DC | PRN
  Administered 2016-11-05: 20 mg via INTRATRACHEAL

## 2016-11-05 MED ORDER — FENTANYL CITRATE (PF) 100 MCG/2ML IJ SOLN: 100.0000 ug | Freq: Once | INTRAMUSCULAR | Status: DC

## 2016-11-05 MED ORDER — MIDAZOLAM HCL 5 MG/5ML IJ SOLN
INTRAMUSCULAR | Status: DC | PRN
  Administered 2016-11-05 (×2): 1 mg via INTRAVENOUS

## 2016-11-05 MED ORDER — FENTANYL CITRATE (PF) 100 MCG/2ML IJ SOLN: 25.0000 ug | INTRAMUSCULAR | Status: DC | PRN

## 2016-11-05 MED ORDER — LACTATED RINGERS IV SOLN
INTRAVENOUS | Status: DC
  Administered 2016-11-05: 17:00:00 via INTRAVENOUS

## 2016-11-05 MED ORDER — 0.9 % SODIUM CHLORIDE (POUR BTL) OPTIME
TOPICAL | Status: DC | PRN
  Administered 2016-11-05: 1000 mL

## 2016-11-05 MED ORDER — LACTATED RINGERS IV SOLN
INTRAVENOUS | Status: DC | PRN
  Administered 2016-11-05: 19:00:00 via INTRAVENOUS

## 2016-11-05 MED ORDER — PROPOFOL 10 MG/ML IV BOLUS
INTRAVENOUS | Status: DC | PRN
  Administered 2016-11-05 (×2): 20 mg via INTRAVENOUS
  Administered 2016-11-05: 10 mg via INTRAVENOUS
  Administered 2016-11-05 (×2): 20 mg via INTRAVENOUS

## 2016-11-05 MED ORDER — FENTANYL CITRATE (PF) 100 MCG/2ML IJ SOLN
INTRAMUSCULAR | Status: AC
  Administered 2016-11-05: 100 ug via INTRAVENOUS
  Filled 2016-11-05: qty 2

## 2016-11-05 MED ORDER — FENTANYL CITRATE (PF) 250 MCG/5ML IJ SOLN
INTRAMUSCULAR | Status: DC | PRN
  Administered 2016-11-05: 50 ug via INTRAVENOUS
  Administered 2016-11-05 (×2): 25 ug via INTRAVENOUS
  Administered 2016-11-05: 50 ug via INTRAVENOUS

## 2016-11-05 MED ORDER — FENTANYL CITRATE (PF) 100 MCG/2ML IJ SOLN
100.0000 ug | Freq: Once | INTRAMUSCULAR | Status: AC
  Administered 2016-11-05: 100 ug via INTRAVENOUS

## 2016-11-05 MED ORDER — MIDAZOLAM HCL 2 MG/2ML IJ SOLN
INTRAMUSCULAR | Status: AC
  Filled 2016-11-05: qty 2

## 2016-11-05 MED ORDER — PROPOFOL 10 MG/ML IV BOLUS
INTRAVENOUS | Status: AC
  Filled 2016-11-05: qty 20

## 2016-11-05 MED ORDER — FENTANYL CITRATE (PF) 250 MCG/5ML IJ SOLN
INTRAMUSCULAR | Status: AC
  Filled 2016-11-05: qty 5

## 2016-11-05 MED ORDER — PROMETHAZINE HCL 25 MG/ML IJ SOLN
6.2500 mg | INTRAMUSCULAR | Status: DC | PRN
Start: 2016-11-05 — End: 2016-11-06

## 2016-11-05 SURGICAL SUPPLY — 61 items
BANDAGE ACE 3X5.8 VEL STRL LF (GAUZE/BANDAGES/DRESSINGS) ×2 IMPLANT
BANDAGE ACE 4X5 VEL STRL LF (GAUZE/BANDAGES/DRESSINGS) ×2 IMPLANT
BIT DRILL 2.2 SS TIBIAL (BIT) ×2 IMPLANT
BLADE CLIPPER SURG (BLADE) IMPLANT
BNDG ESMARK 4X9 LF (GAUZE/BANDAGES/DRESSINGS) ×2 IMPLANT
BNDG GAUZE ELAST 4 BULKY (GAUZE/BANDAGES/DRESSINGS) ×2 IMPLANT
CANISTER SUCT 3000ML PPV (MISCELLANEOUS) ×2 IMPLANT
CORDS BIPOLAR (ELECTRODE) ×2 IMPLANT
COVER SURGICAL LIGHT HANDLE (MISCELLANEOUS) ×2 IMPLANT
CUFF TOURNIQUET SINGLE 18IN (TOURNIQUET CUFF) ×2 IMPLANT
CUFF TOURNIQUET SINGLE 24IN (TOURNIQUET CUFF) IMPLANT
DECANTER SPIKE VIAL GLASS SM (MISCELLANEOUS) IMPLANT
DRAPE OEC MINIVIEW 54X84 (DRAPES) ×2 IMPLANT
DRAPE SURG 17X11 SM STRL (DRAPES) ×2 IMPLANT
DRSG ADAPTIC 3X8 NADH LF (GAUZE/BANDAGES/DRESSINGS) ×2 IMPLANT
GAUZE SPONGE 4X4 12PLY STRL (GAUZE/BANDAGES/DRESSINGS) ×2 IMPLANT
GAUZE SPONGE 4X4 16PLY XRAY LF (GAUZE/BANDAGES/DRESSINGS) ×2 IMPLANT
GAUZE XEROFORM 5X9 LF (GAUZE/BANDAGES/DRESSINGS) IMPLANT
GLOVE BIOGEL PI IND STRL 8.5 (GLOVE) ×1 IMPLANT
GLOVE BIOGEL PI INDICATOR 8.5 (GLOVE) ×1
GLOVE SURG ORTHO 8.0 STRL STRW (GLOVE) ×2 IMPLANT
GOWN STRL REUS W/ TWL LRG LVL3 (GOWN DISPOSABLE) ×1 IMPLANT
GOWN STRL REUS W/ TWL XL LVL3 (GOWN DISPOSABLE) ×1 IMPLANT
GOWN STRL REUS W/TWL LRG LVL3 (GOWN DISPOSABLE) ×1
GOWN STRL REUS W/TWL XL LVL3 (GOWN DISPOSABLE) ×1
K-WIRE 1.6 (WIRE) ×1
K-WIRE FX5X1.6XNS BN SS (WIRE) ×1
KIT BASIN OR (CUSTOM PROCEDURE TRAY) ×2 IMPLANT
KIT ROOM TURNOVER OR (KITS) ×2 IMPLANT
KWIRE FX5X1.6XNS BN SS (WIRE) ×1 IMPLANT
NEEDLE HYPO 25X1 1.5 SAFETY (NEEDLE) ×2 IMPLANT
NS IRRIG 1000ML POUR BTL (IV SOLUTION) ×2 IMPLANT
PACK ORTHO EXTREMITY (CUSTOM PROCEDURE TRAY) ×2 IMPLANT
PAD ARMBOARD 7.5X6 YLW CONV (MISCELLANEOUS) ×4 IMPLANT
PAD CAST 3X4 CTTN HI CHSV (CAST SUPPLIES) ×1 IMPLANT
PAD CAST 4YDX4 CTTN HI CHSV (CAST SUPPLIES) IMPLANT
PADDING CAST ABS 3INX4YD NS (CAST SUPPLIES) ×1
PADDING CAST ABS COTTON 3X4 (CAST SUPPLIES) ×1 IMPLANT
PADDING CAST COTTON 3X4 STRL (CAST SUPPLIES) ×1
PADDING CAST COTTON 4X4 STRL (CAST SUPPLIES)
PEG LOCKING SMOOTH 2.2X18 (Peg) ×4 IMPLANT
PEG LOCKING SMOOTH 2.2X20 (Screw) ×4 IMPLANT
PEG LOCKING SMOOTH 2.2X22 (Screw) ×4 IMPLANT
PLATE DVR CROSSLOCK STD RT (Plate) ×2 IMPLANT
SCREW LOCK 14X2.7X 3 LD TPR (Screw) ×4 IMPLANT
SCREW LOCKING 2.7X13MM (Screw) ×2 IMPLANT
SCREW LOCKING 2.7X14 (Screw) ×4 IMPLANT
SCREW MULTI DIRECTIONAL 2.7X18 (Screw) ×2 IMPLANT
SOAP 2 % CHG 4 OZ (WOUND CARE) ×2 IMPLANT
SPLINT FIBERGLASS 3X35 (CAST SUPPLIES) ×2 IMPLANT
SPONGE LAP 4X18 X RAY DECT (DISPOSABLE) ×2 IMPLANT
SUT PROLENE 4 0 PS 2 18 (SUTURE) IMPLANT
SUT VIC AB 2-0 CT1 27 (SUTURE)
SUT VIC AB 2-0 CT1 TAPERPNT 27 (SUTURE) IMPLANT
SUT VICRYL 4-0 PS2 18IN ABS (SUTURE) IMPLANT
SYR CONTROL 10ML LL (SYRINGE) IMPLANT
TOWEL OR 17X24 6PK STRL BLUE (TOWEL DISPOSABLE) IMPLANT
TOWEL OR 17X26 10 PK STRL BLUE (TOWEL DISPOSABLE) ×2 IMPLANT
TUBE CONNECTING 12X1/4 (SUCTIONS) ×2 IMPLANT
WATER STERILE IRR 1000ML POUR (IV SOLUTION) IMPLANT
YANKAUER SUCT BULB TIP NO VENT (SUCTIONS) IMPLANT

## 2016-11-05 NOTE — Anesthesia Preprocedure Evaluation (Addendum)
Anesthesia Evaluation  Patient identified by MRN, date of birth, ID band Patient awake    Reviewed: Allergy & Precautions, NPO status , Patient's Chart, lab work & pertinent test results, reviewed documented beta blocker date and time   History of Anesthesia Complications (+) PONV and history of anesthetic complications  Airway Mallampati: II  TM Distance: >3 FB     Dental  (+) Chipped   Pulmonary neg pulmonary ROS,    breath sounds clear to auscultation       Cardiovascular negative cardio ROS   Rhythm:Regular Rate:Normal     Neuro/Psych negative neurological ROS  negative psych ROS   GI/Hepatic negative GI ROS, Neg liver ROS,   Endo/Other  negative endocrine ROS  Renal/GU negative Renal ROS     Musculoskeletal  (+) Arthritis ,   Abdominal   Peds  Hematology negative hematology ROS (+)   Anesthesia Other Findings   Reproductive/Obstetrics negative OB ROS                             Anesthesia Physical  Anesthesia Plan  ASA: II  Anesthesia Plan: Regional   Post-op Pain Management:    Induction:   PONV Risk Score and Plan: 3 and Ondansetron, Dexamethasone, Midazolam and Propofol infusion  Airway Management Planned:   Additional Equipment:   Intra-op Plan:   Post-operative Plan:   Informed Consent: I have reviewed the patients History and Physical, chart, labs and discussed the procedure including the risks, benefits and alternatives for the proposed anesthesia with the patient or authorized representative who has indicated his/her understanding and acceptance.   Dental advisory given  Plan Discussed with: CRNA  Anesthesia Plan Comments:         Anesthesia Quick Evaluation

## 2016-11-05 NOTE — Discharge Instructions (Signed)
KEEP BANDAGE CLEAN AND DRY CALL OFFICE FOR F/U APPT [phone] KEEP HAND ELEVATED ABOVE HEART OK TO APPLY ICE TO OPERATIVE AREA CONTACT OFFICE IF ANY WORSENING PAIN OR CONCERNS. DR [facility] CELL [phone]

## 2016-11-05 NOTE — Op Note (Signed)
PREOPERATIVE DIAGNOSIS: Right wrist intra-articular distal radius fracture of 3 or more fragments  POSTOPERATIVE DIAGNOSIS: Same  ATTENDING SURGEON: Dr. Gilman Schmidt who was scrubbed and present for the entire procedure  ASSISTANT SURGEON: None  ANESTHESIA:Block with sedation  OPERATIVE PROCEDURE: #1: Open treatment of right wrist intra-articular distal radius fracture 3 more fragments with internal fixation #2: Right wrist brachia radialis tendon release, tendon tenotomy #3: Radiographs 3 views right wrist  IMPLANTS: [employer] DVR cross lock standard  RADIOGRAPHIC INTERPRETATION: AP lateral oblique views the wrist to show the volar plate fixation place with good restoration of the radial height inclination and tilt  SURGICAL INDICATIONS: the patient is a right-hand-dominant female who fell on vacation. Patient sustained a closed intra-articular distal radius fracture. Patient elected undergo the above procedure. Risks benefits and alternatives were discussed in detail with the patient in a signed informed consent was obtained. Risks include but not limited to bleeding infection damage to nearby nerves arteries or tendons loss of motion of the wrists and digits incomplete relief of symptoms and need for further surgical intervention.  SURGICAL TECHNIQUE: Patient is properly identified in the preoperative holding area and a mark with a permanent marker made on the right wrist indicate correct operative site. Patient was then brought back to operating room placed supine on anesthesia and table where general anesthesia was administered. Patient tolerated this well. A well-padded tourniquet was placed on the right brachium and sealed with the appropriate drape. Right upper extremity was then prepped and draped in sterile fashion. A timeout was called the correct site was identified and the procedure was then begun. Attention was then turned to the left wrist. The limb was then elevated. A  longitudinal incision was then made directly over the flexor carpi radialis. Dissection was then carried out through the skin and subcutaneous tissue. Deep dissection carried through the flexor carpi radialis tendon sheath. The pronator quadratus was then exposed. Careful elevation of the FPL was done. An L-shaped pronator quadratus flap was then elevated. The brachia radialis was then carefully elevated and released and tendon tenotomy was then carried out to reduce the radial radial side of the joint. This was an intra-articular fracture 3 or more fragments. Open reduction was then performed. The volar plate was then applied and held distally with a K wire. Plate position was then confirmed. The oblong screw hole was then placed proximally. Following this distal fixation was then carried out from an ulnar to radial direction with a comminuted distal locking pegs and one multidirectional screws in the ulnar column. Final shaft fixation was then carried out. The wound was irrigated. The pronator quadratus closed with 2-0 Vicryl. Subcutaneous tissues closed with 4-0 Vicryl. Skin was then closed with simple 4-0 Prolene sutures. Final radiographs then obtained. Adaptic dressing sterile compressive bandage then applied. Patient is then placed in well-padded sugar tong splint accident taken recovery room in good condition.  POSTOPERATIVE PLAN: Patient be discharged to home seen back now office in approximately 2 weeks for wound check suture removal x-rays application of a short arm cast place the therapy order to begin at the four-week mark cast for additional 2 weeks see her back the four-week mark for x-rays out of the cast then begin an outpatient therapy regimen.

## 2016-11-05 NOTE — H&P (Signed)
Cassandra Malone is an 61 y.o. female.   Chief Complaint:right distal radius fracture HPI: the patient sustained a closed injury while in [place] last week. Patient was seen and evaluated in the office and recommended undergo the below procedure.risks benefits and alternatives were discussed in detail with the patient and signed informed consent was obtained today. Patient is here for surgery. No prior surgery to the right wrist.  Past Medical History:  Diagnosis Date  . Allergy    food, never tested  . Arthritis   . Complication of anesthesia   . Family history of adverse reaction to anesthesia    Adopted  . PONV (postoperative nausea and vomiting)     Past Surgical History:  Procedure Laterality Date  . CHONDROPLASTY Left 08/24/2015   Procedure: CHONDROPLASTY;  Surgeon: Donato Heinz, MD;  Location: [facility] ORS;  Service: Orthopedics;  Laterality: Left;  . KNEE ARTHROSCOPY WITH LATERAL MENISECTOMY Left 08/24/2015   Procedure: KNEE ARTHROSCOPY WITH LATERAL MENISECTOMY;  Surgeon: Donato Heinz, MD;  Location: [facility] ORS;  Service: Orthopedics;  Laterality: Left;  . KNEE ARTHROSCOPY WITH MEDIAL MENISECTOMY Left 05/27/2014   Procedure: KNEE ARTHROSCOPY WITH  PARTIAL MEDIAL MENISECTOMY, SYNOVECTOMY LEFT;  Surgeon: Kennedy Bucker, MD;  Location: [facility] ORS;  Service: Orthopedics;  Laterality: Left;  . KNEE ARTHROSCOPY WITH MEDIAL MENISECTOMY Left 08/24/2015   Procedure: KNEE ARTHROSCOPY WITH MEDIAL MENISECTOMY;  Surgeon: Donato Heinz, MD;  Location: [facility] ORS;  Service: Orthopedics;  Laterality: Left;    Family History  Problem Relation Age of Onset  . Adopted: Yes   Social History:  reports that she has never smoked. She has never used smokeless tobacco. She reports that she drinks about 0.6 oz of alcohol per week . She reports that she does not use drugs.  Allergies:  Allergies  Allergen Reactions  . Latex Rash    Advises "one time itching with I think the gloves at the gynecologist's  office" 11-05-16 PT DENIES ALLERGY TO LATEX    Medications Prior to Admission  Medication Sig Dispense Refill  . acetaminophen (TYLENOL) 325 MG tablet Take 650 mg by mouth every 6 (six) hours as needed for mild pain.    . cholecalciferol (VITAMIN D) 1000 units tablet Take 1,000 Units by mouth 2 (two) times daily.    Marland Kitchen co-enzyme Q-10 50 MG capsule Take 100 mg by mouth daily.    Marland Kitchen L-Theanine 100 MG CAPS Take 1 capsule by mouth 2 (two) times daily.     . Multiple Vitamins-Minerals (HAIR VITAMINS PO) Take 1 capsule by mouth 2 (two) times daily.     . naproxen sodium (ANAPROX) 220 MG tablet Take 220 mg by mouth daily as needed (pain).    . Nattokinase 100 MG CAPS Take 100 mg by mouth 2 (two) times daily.     . Omega-3 Fatty Acids (FISH OIL) 500 MG CAPS Take 500 mg by mouth daily.    Marland Kitchen OVER THE COUNTER MEDICATION Take 1 tablet by mouth daily. Relora    . OVER THE COUNTER MEDICATION Take 1 tablet by mouth daily. DMAE    . OVER THE COUNTER MEDICATION Take 1 tablet by mouth daily. glycine    . Vinpocetine POWD Take 1 Dose by mouth 2 (two) times daily.     . vitamin E 400 UNIT capsule Take 400 Units by mouth daily.    Marland Kitchen sulfamethoxazole-trimethoprim (BACTRIM DS,SEPTRA DS) 800-160 MG tablet Take 1 tablet by mouth 2 (two) times daily. For 7 days Started  on 10-26-16  2    No results found for this or any previous visit (from the past 48 hour(s)). No results found.  ROSno recent illnesses or hospitalizations.  Blood pressure (!) 161/78, pulse 67, temperature 98.9 F (37.2 C), temperature source Oral, resp. rate 14, SpO2 100 %. Physical Exam  General Appearance:  Alert, cooperative, no distress, appears stated age  Head:  Normocephalic, without obvious abnormality, atraumatic  Eyes:  Pupils equal, conjunctiva/corneas clear,         Throat: Lips, mucosa, and tongue normal; teeth and gums normal  Neck: No visible masses     Lungs:   respirations unlabored  Chest Wall:  No tenderness or deformity   Heart:  Regular rate and rhythm,  Abdomen:   Soft, non-tender,         Extremities: The patient's skin is intact. She is able to extend her thumb extend her digits are fingertips are warm well perfused  Pulses: 2+ and symmetric  Skin: Skin color, texture, turgor normal, no rashes or lesions     Neurologic: Normal    Assessment/Plan Right wrist comminuted intra-articular distal radius fracture  Right wrist open reduction internal fixation and repair as indicated  R/B/A DISCUSSED WITH PT IN OFFICE.  PT VOICED UNDERSTANDING OF PLAN CONSENT SIGNED DAY OF SURGERY PT SEEN AND EXAMINED PRIOR TO OPERATIVE PROCEDURE/DAY OF SURGERY SITE MARKED. QUESTIONS ANSWERED WILL GO HOME FOLLOWING SURGERY  WE ARE PLANNING SURGERY FOR YOUR UPPER EXTREMITY. THE RISKS AND BENEFITS OF SURGERY INCLUDE BUT NOT LIMITED TO BLEEDING INFECTION, DAMAGE TO NEARBY NERVES ARTERIES TENDONS, FAILURE OF SURGERY TO ACCOMPLISH ITS INTENDED GOALS, PERSISTENT SYMPTOMS AND NEED FOR FURTHER SURGICAL INTERVENTION. WITH THIS IN MIND WE WILL PROCEED. I HAVE DISCUSSED WITH THE PATIENT THE PRE AND POSTOPERATIVE REGIMEN AND THE DOS AND DON'TS. PT VOICED UNDERSTANDING AND INFORMED CONSENT SIGNED.  Cassandra Malone 11/05/2016, 7:10 PM

## 2016-11-05 NOTE — Anesthesia Procedure Notes (Signed)
Anesthesia Regional Block: Axillary brachial plexus block   Pre-Anesthetic Checklist: ,, timeout performed, Correct Patient, Correct Site, Correct Laterality, Correct Procedure, Correct Position, site marked, Risks and benefits discussed,  Surgical consent,  Pre-op evaluation,  At surgeon's request and post-op pain management  Laterality: Right  Prep: chloraprep       Needles:  Injection technique: Single-shot  Needle Type: Stimulator Needle - 40     Needle Length: 4cm  Needle Gauge: 22     Additional Needles:   Procedures:,,,, ultrasound used (permanent image in chart),,,,  Narrative:  Start time: 11/05/2016 6:31 PM End time: 11/05/2016 6:37 PM Injection made incrementally with aspirations every 5 mL. Anesthesiologist: Lewie Loron  Additional Notes: BP cuff, EKG monitors applied. Sedation begun. Nerve location verified with U/S. Anesthetic injected incrementally, slowly , and after neg aspirations under direct u/s guidance. Good perineural spread. Tolerated well.

## 2016-11-05 NOTE — Transfer of Care (Signed)
Immediate Anesthesia Transfer of Care Note  Patient: Cassandra Malone  Procedure(s) Performed: OPEN REDUCTION INTERNAL FIXATION (ORIF) DISTAL RADIAL FRACTURE (Right )  Patient Location: PACU  Anesthesia Type:MAC combined with regional for post-op pain  Level of Consciousness: awake, alert  and oriented  Airway & Oxygen Therapy: Patient Spontanous Breathing  Post-op Assessment: Report given to RN and Post -op Vital signs reviewed and stable  Post vital signs: Reviewed and stable  Last Vitals:  Vitals:   11/05/16 1840 11/05/16 2044  BP:    Pulse: 67 69  Resp: 14 14  Temp:    SpO2: 100% 100%    Last Pain:  Vitals:   11/05/16 1648  TempSrc: Oral  PainSc: 1       Patients Stated Pain Goal: 3 (29/47/65 4650)  Complications: No apparent anesthesia complications

## 2016-11-06 ENCOUNTER — Encounter (HOSPITAL_COMMUNITY): Payer: Self-pay | Admitting: Orthopedic Surgery

## 2016-11-06 NOTE — Anesthesia Postprocedure Evaluation (Signed)
Anesthesia Post Note  Patient: Cassandra Malone  Procedure(s) Performed: OPEN REDUCTION INTERNAL FIXATION (ORIF) DISTAL RADIAL FRACTURE (Right Wrist)     Patient location during evaluation: PACU Anesthesia Type: Regional Level of consciousness: awake and alert Pain management: pain level controlled Vital Signs Assessment: post-procedure vital signs reviewed and stable Respiratory status: spontaneous breathing Cardiovascular status: stable Anesthetic complications: no    Last Vitals:  Vitals:   11/05/16 2100 11/05/16 2113  BP: (!) 148/72 (!) 154/93  Pulse: 60 66  Resp: 12 14  Temp:  36.5 C  SpO2: 100% 100%    Last Pain:  Vitals:   11/05/16 2044  TempSrc:   PainSc: 0-No pain                 Lewie Loron

## 2016-11-27 ENCOUNTER — Ambulatory Visit (INDEPENDENT_AMBULATORY_CARE_PROVIDER_SITE_OTHER): Payer: PRIVATE HEALTH INSURANCE | Admitting: Obstetrics & Gynecology

## 2016-11-27 ENCOUNTER — Encounter: Payer: Self-pay | Admitting: Obstetrics & Gynecology

## 2016-11-27 VITALS — BP 120/80 | Ht 65.0 in | Wt 135.0 lb

## 2016-11-27 DIAGNOSIS — N390 Urinary tract infection, site not specified: Secondary | ICD-10-CM | POA: Diagnosis not present

## 2016-11-27 DIAGNOSIS — R102 Pelvic and perineal pain: Secondary | ICD-10-CM | POA: Diagnosis not present

## 2016-11-27 DIAGNOSIS — N83202 Unspecified ovarian cyst, left side: Secondary | ICD-10-CM

## 2016-11-27 LAB — POCT URINALYSIS DIPSTICK
Bilirubin, UA: NEGATIVE
Blood, UA: POSITIVE
Glucose, UA: NEGATIVE
Ketones, UA: NEGATIVE
Nitrite, UA: NEGATIVE
Protein, UA: NEGATIVE
Spec Grav, UA: 1.02
Urobilinogen, UA: 1 U/dL
pH, UA: 6.5

## 2016-11-27 MED ORDER — CIPROFLOXACIN HCL 500 MG PO TABS: 500.0000 mg | ORAL_TABLET | Freq: Two times a day (BID) | ORAL | 0 refills | Status: DC

## 2016-11-27 NOTE — Patient Instructions (Signed)
Ciprofloxacin tablets  What is this medicine?  CIPROFLOXACIN (sip roe FLOX a sin) is a quinolone antibiotic. It is used to treat certain kinds of bacterial infections. It will not work for colds, flu, or other viral infections.  This medicine may be used for other purposes; ask your health care provider or pharmacist if you have questions.  COMMON BRAND NAME(S): Cipro  What should I tell my health care provider before I take this medicine?  They need to know if you have any of these conditions:  -bone problems  -history of low levels of potassium in the blood  -joint problems  -irregular heartbeat  -kidney disease  -myasthenia gravis  -seizures  -tendon problems  -tingling of the fingers or toes, or other nerve disorder  -an unusual or allergic reaction to ciprofloxacin, other antibiotics or medicines, foods, dyes, or preservatives  -pregnant or trying to get pregnant  -breast-feeding  How should I use this medicine?  Take this medicine by mouth with a glass of water. Follow the directions on the prescription label. Take your medicine at regular intervals. Do not take your medicine more often than directed. Take all of your medicine as directed even if you think your are better. Do not skip doses or stop your medicine early.  You can take this medicine with food or on an empty stomach. It can be taken with a meal that contains dairy or calcium, but do not take it alone with a dairy product, like milk or yogurt or calcium-fortified juice.  A special MedGuide will be given to you by the pharmacist with each prescription and refill. Be sure to read this information carefully each time.  Talk to your pediatrician regarding the use of this medicine in children. Special care may be needed.  Overdosage: If you think you have taken too much of this medicine contact a poison control center or emergency room at once.  NOTE: This medicine is only for you. Do not share this medicine with others.  What if I miss a dose?  If you  miss a dose, take it as soon as you can. If it is almost time for your next dose, take only that dose. Do not take double or extra doses.  What may interact with this medicine?  Do not take this medicine with any of the following medications:  -cisapride  -dofetilide  -dronedarone  -flibanserin  -lomitapide  -pimozide  -thioridazine  -tizanidine  -ziprasidone  This medicine may also interact with the following medications:  -antacids  -birth control pills  -caffeine  -certain medicines for diabetes, like glipizide or glyburide  -certain medicines that treat or prevent blood clots like warfarin  -clozapine  -cyclosporine  -didanosine (ddI) buffered tablets or powder  -duloxetine  -lanthanum carbonate  -lidocaine  -methotrexate  -multivitamins  -NSAIDS, medicines for pain and inflammation, like ibuprofen or naproxen  -olanzapine  -omeprazole  -other medicines that prolong the QT interval (cause an abnormal heart rhythm)  -phenytoin  -probenecid  -ropinirole  -sevelamer  -sildenafil  -sucralfate  -theophylline  -zolpidem  This list may not describe all possible interactions. Give your health care provider a list of all the medicines, herbs, non-prescription drugs, or dietary supplements you use. Also tell them if you smoke, drink alcohol, or use illegal drugs. Some items may interact with your medicine.  What should I watch for while using this medicine?  Tell your doctor or health care professional if your symptoms do not improve.  Do not treat   diarrhea with over the counter products. Contact your doctor if you have diarrhea that lasts more than 2 days or if it is severe and watery.  You may get drowsy or dizzy. Do not drive, use machinery, or do anything that needs mental alertness until you know how this medicine affects you. Do not stand or sit up quickly, especially if you are an older patient. This reduces the risk of dizzy or fainting spells.  This medicine can make you more sensitive to the sun. Keep out of the  sun. If you cannot avoid being in the sun, wear protective clothing and use sunscreen. Do not use sun lamps or tanning beds/booths.  Avoid antacids, aluminum, calcium, iron, magnesium, and zinc products for 6 hours before and 2 hours after taking a dose of this medicine.  What side effects may I notice from receiving this medicine?  Side effects that you should report to your doctor or health care professional as soon as possible:  -allergic reactions like skin rash or hives, swelling of the face, lips, or tongue  -anxious  -confusion  -depressed mood  -diarrhea  -fast, irregular heartbeat  -hallucination, loss of contact with reality  -joint, muscle, or tendon pain or swelling  -pain, tingling, numbness in the hands or feet  -suicidal thoughts or other mood changes  -sunburn  -unusually weak or tired  Side effects that usually do not require medical attention (report to your doctor or health care professional if they continue or are bothersome):  -dry mouth  -headache  -nausea  -trouble sleeping  This list may not describe all possible side effects. Call your doctor for medical advice about side effects. You may report side effects to FDA at 1-800-FDA-1088.  Where should I keep my medicine?  Keep out of the reach of children.  Store at room temperature below 30 degrees C (86 degrees F). Keep container tightly closed. Throw away any unused medicine after the expiration date.  NOTE: This sheet is a summary. It may not cover all possible information. If you have questions about this medicine, talk to your doctor, pharmacist, or health care provider.  © 2018 Elsevier/Gold Standard (2015-08-19 14:42:02)

## 2016-11-27 NOTE — Progress Notes (Signed)
Gynecology Pelvic Pain Evaluation   Chief Complaint:  Chief Complaint  Patient presents with  . Pelvic Pain   History of Present Illness:   Patient is a 61 y.o. G0P0000 who is postmenopausal., presents today for a problem visit.  She complains of pain.   Her pain is localized to the suprapubic area, described as intermittent, dull and aching, began several weeks ago and its severity is described as moderate. The pain radiates to the  back. She has these associated symptoms which include none. Patient has these modifiers which include nothing that make it better and unable to associate with any factor that make it worse.  Context includes: spontaneous and she is not sexually active.  She has been treated for UTI in Sept/Oct twice (Macrobid, Bactrim) by urology.  Prior h/o Ovarian Cyst last noted 2 years ago to be small (2.5 cm)Mand w neg CA125; bc min to no sx's had not had reason to follow up since.  Last PAP 2015, normal.    PMHx: She  has a past medical history of Allergy, Arthritis, Complication of anesthesia, Family history of adverse reaction to anesthesia, Ovarian cyst, PONV (postoperative nausea and vomiting), and UTI (urinary tract infection). Also,  has a past surgical history that includes OPEN REDUCTION INTERNAL FIXATION (ORIF) DISTAL RADIAL FRACTURE (Right, 11/05/2016); CHONDROPLASTY (Left, 08/24/2015); KNEE ARTHROSCOPY WITH MEDIAL MENISECTOMY (Left, 08/24/2015); KNEE ARTHROSCOPY WITH LATERAL MENISECTOMY (Left, 08/24/2015); and KNEE ARTHROSCOPY WITH  PARTIAL MEDIAL MENISECTOMY, SYNOVECTOMY LEFT (Left, 05/27/2014)., family history is not on file. She was adopted.,  reports that  has never smoked. she has never used smokeless tobacco. She reports that she drinks about 0.6 oz of alcohol per week. She reports that she does not use drugs.  She has a current medication list which includes the following prescription(s): acetaminophen, cholecalciferol, ciprofloxacin, co-enzyme q-10, l-theanine,  multiple vitamins-minerals, naproxen sodium, nattokinase, fish oil, OVER THE COUNTER MEDICATION, OVER THE COUNTER MEDICATION, OVER THE COUNTER MEDICATION, sulfamethoxazole-trimethoprim, vinpocetine, and vitamin e. Also, is allergic to latex.  Review of Systems  Constitutional: Negative for chills, fever and malaise/fatigue.  HENT: Negative for congestion, sinus pain and sore throat.   Eyes: Negative for blurred vision and pain.  Respiratory: Negative for cough and wheezing.   Cardiovascular: Negative for chest pain and leg swelling.  Gastrointestinal: Negative for abdominal pain, constipation, diarrhea, heartburn, nausea and vomiting.  Genitourinary: Negative for dysuria, frequency, hematuria and urgency.  Musculoskeletal: Negative for back pain, joint pain, myalgias and neck pain.  Skin: Negative for itching and rash.  Neurological: Negative for dizziness, tremors and weakness.  Endo/Heme/Allergies: Does not bruise/bleed easily.  Psychiatric/Behavioral: Negative for depression. The patient is not nervous/anxious and does not have insomnia.     Objective: BP 120/80   Ht 5\' 5"  (1.651 m)   Wt 135 lb (61.2 kg)   BMI 22.47 kg/m  Physical Exam  Constitutional: She is oriented to person, place, and time. She appears well-developed and well-nourished. No distress.  Genitourinary: Rectum normal, vagina normal and uterus normal. Pelvic exam was performed with patient supine. There is no rash or lesion on the right labia. There is no rash or lesion on the left labia. Vagina exhibits no lesion. No bleeding in the vagina. Right adnexum does not display mass and does not display tenderness. Left adnexum does not display mass and does not display tenderness. Cervix does not exhibit motion tenderness, lesion, friability or polyp.   Uterus is mobile and midaxial. Uterus is not enlarged or exhibiting a  mass.  HENT:  Head: Normocephalic and atraumatic. Head is without laceration.  Right Ear: Hearing  normal.  Left Ear: Hearing normal.  Nose: No epistaxis.  No foreign bodies.  Mouth/Throat: Uvula is midline, oropharynx is clear and moist and mucous membranes are normal.  Eyes: Pupils are equal, round, and reactive to light.  Neck: Normal range of motion. Neck supple. No thyromegaly present.  Cardiovascular: Normal rate and regular rhythm. Exam reveals no gallop and no friction rub.  No murmur heard. Pulmonary/Chest: Effort normal and breath sounds normal. No respiratory distress. She has no wheezes. Right breast exhibits no mass, no skin change and no tenderness. Left breast exhibits no mass, no skin change and no tenderness.  Abdominal: Soft. Bowel sounds are normal. She exhibits no distension. There is no tenderness. There is no rebound.  Musculoskeletal: Normal range of motion.  Neurological: She is alert and oriented to person, place, and time. No cranial nerve deficit.  Skin: Skin is warm and dry.  Psychiatric: She has a normal mood and affect. Judgment normal.  Vitals reviewed.   Female chaperone present for pelvic portion of the physical exam  Assessment: 61 y.o. G0P0000 with Infectious: acute to chronic cystitis..  1. Pelvic pain - POCT urinalysis dipstick - Urine Culture - US PELVIS TRANSVANGINAL NON-OB (TV ONLY); Future  2. Cyst of ovary, left (prior history) - US PELVIS TRANSVANGINAL NON-OB (TV ONLY); Future  3. Recurrent UTI - Urine Culture - Treat w Cipro - f/u UA for test of recurrence in 2 weeks  Problem List Items Addressed This Visit      Genitourinary   Recurrent UTI   Relevant Orders   Urine Culture    Other Visit Diagnoses    Pelvic pain    -  Primary   Relevant Orders   POCT urinalysis dipstick (Completed)   Urine Culture   US PELVIS TRANSVANGINAL NON-OB (TV ONLY)   Cyst of ovary, left       Relevant Orders   US PELVIS TRANSVANGINAL NON-OB (TV ONLY)     Annamarie MajorPaul Claude Waldman, MD, FACOG [facility], [facility] 11/27/2016  2:10  PM

## 2016-11-29 LAB — URINE CULTURE

## 2016-12-10 ENCOUNTER — Ambulatory Visit: Payer: PRIVATE HEALTH INSURANCE | Admitting: Obstetrics & Gynecology

## 2016-12-10 ENCOUNTER — Encounter: Payer: Self-pay | Admitting: Obstetrics & Gynecology

## 2016-12-10 ENCOUNTER — Ambulatory Visit (INDEPENDENT_AMBULATORY_CARE_PROVIDER_SITE_OTHER): Payer: PRIVATE HEALTH INSURANCE

## 2016-12-10 VITALS — BP 120/80 | Ht 65.0 in | Wt 138.0 lb

## 2016-12-10 DIAGNOSIS — N83202 Unspecified ovarian cyst, left side: Secondary | ICD-10-CM | POA: Diagnosis not present

## 2016-12-10 DIAGNOSIS — R1032 Left lower quadrant pain: Secondary | ICD-10-CM | POA: Diagnosis not present

## 2016-12-10 DIAGNOSIS — R102 Pelvic and perineal pain: Secondary | ICD-10-CM

## 2016-12-10 DIAGNOSIS — N390 Urinary tract infection, site not specified: Secondary | ICD-10-CM

## 2016-12-10 NOTE — Progress Notes (Signed)
  HPI: Prior LLQ pain, resolving somewhat now.  Has a history of ovarian cyst in past.  No radiation, assoc sx's. modifiers at this time. Also has had recurrent UTI but recent tx w Cipro really helped w back pain and dysuria.  Prior use of Macrobid and Bactrim did not seem to completely help. Ultrasound demonstrates no masses seen These findings are Pelvis normal  PMHx: She  has a past medical history of Allergy, Arthritis, Complication of anesthesia, Family history of adverse reaction to anesthesia, Ovarian cyst, PONV (postoperative nausea and vomiting), and UTI (urinary tract infection). Also,  has a past surgical history that includes Knee arthroscopy with medial menisectomy (Left, 05/27/2014); Chondroplasty (Left, 08/24/2015); Knee arthroscopy with medial menisectomy (Left, 08/24/2015); Knee arthroscopy with lateral menisectomy (Left, 08/24/2015); and Open reduction internal fixation (orif) distal radial fracture (Right, 11/05/2016)., family history is not on file. She was adopted.,  reports that  has never smoked. she has never used smokeless tobacco. She reports that she drinks about 0.6 oz of alcohol per week. She reports that she does not use drugs.  She has a current medication list which includes the following prescription(s): acetaminophen, cholecalciferol, ciprofloxacin, co-enzyme q-10, l-theanine, multiple vitamins-minerals, naproxen sodium, nattokinase, fish oil, OVER THE COUNTER MEDICATION, OVER THE COUNTER MEDICATION, OVER THE COUNTER MEDICATION, sulfamethoxazole-trimethoprim, vinpocetine, and vitamin e. Also, is allergic to latex.  Review of Systems  All other systems reviewed and are negative.  Objective: BP 120/80   Ht 5\' 5"  (1.651 m)   Wt 138 lb (62.6 kg)   BMI 22.96 kg/m   Physical examination Constitutional NAD, Conversant  Skin No rashes, lesions or ulceration.   Extremities: Moves all appropriately.  Normal ROM for age. No lymphadenopathy.  Neuro: Grossly intact  Psych:  Oriented to PPT.  Normal mood. Normal affect.   Assessment:  Left lower quadrant pain History of left ovarian cyst; resolved Recurrent UTI   Plan: Urine Culture to test for cure/ asymptomatic bacteruria Monitor for any future LLQ pain  A total of 15 minutes were spent face-to-face with the patient during this encounter and over half of that time dealt with counseling and coordination of care.  Counseled as to the risks and signs for recurrent UTI or even kidney infection as well as future ovarian cyst risks.  Annamarie MajorPaul Mellisa Arshad, MD, Merlinda FrederickFACOG [facility], [facility] 12/10/2016  3:02 PM

## 2016-12-12 LAB — URINE CULTURE

## 2016-12-15 ENCOUNTER — Other Ambulatory Visit: Payer: Self-pay | Admitting: Obstetrics & Gynecology

## 2016-12-15 MED ORDER — LEVOFLOXACIN 500 MG PO TABS: 500.0000 mg | ORAL_TABLET | Freq: Every day | ORAL | 0 refills | Status: AC

## 2016-12-15 NOTE — Progress Notes (Signed)
NA.  Needs re-treatment for UTI.  Will call again.  eRx sent for Levofloxacin.

## 2016-12-17 ENCOUNTER — Telehealth: Payer: Self-pay | Admitting: Obstetrics & Gynecology

## 2016-12-17 NOTE — Telephone Encounter (Signed)
Pt is calling due to missed call. Please advise °

## 2016-12-17 NOTE — Progress Notes (Signed)
Pt had called.  Returned call, NA again. LM

## 2016-12-17 NOTE — Progress Notes (Signed)
LM.  Take ABX as prescribed and follow up based on sx's.  Await call back but eRx has been sent.

## 2016-12-18 NOTE — Telephone Encounter (Signed)
Pt returning [facility] call from earlier today. [facility]. She will call back tomorrow. She did [facility] [phone]#[phone]

## 2016-12-18 NOTE — Progress Notes (Signed)
D/w pt

## 2016-12-20 NOTE — Telephone Encounter (Signed)
I did at some point.  Has she called again?

## 2016-12-20 NOTE — Telephone Encounter (Signed)
no

## 2016-12-20 NOTE — Telephone Encounter (Signed)
Did you ever speak with pt ?

## 2017-01-09 ENCOUNTER — Ambulatory Visit: Payer: PRIVATE HEALTH INSURANCE

## 2017-01-09 DIAGNOSIS — R300 Dysuria: Secondary | ICD-10-CM

## 2017-01-10 ENCOUNTER — Ambulatory Visit: Payer: PRIVATE HEALTH INSURANCE

## 2017-01-11 ENCOUNTER — Other Ambulatory Visit: Payer: Self-pay | Admitting: Obstetrics & Gynecology

## 2017-01-11 LAB — URINE CULTURE

## 2017-01-11 MED ORDER — CIPROFLOXACIN HCL 500 MG PO TABS: 500.0000 mg | ORAL_TABLET | Freq: Two times a day (BID) | ORAL | 0 refills | Status: AC

## 2017-01-11 NOTE — Progress Notes (Signed)
Let her know culture pos again, yet sensitive to Cipro which has worked for her in past.  Will treat twice daily for two weeks to ensure complete eradication of this recurrent UTI.  ERx done.

## 2017-01-11 NOTE — Progress Notes (Signed)
lmtrc

## 2017-02-06 ENCOUNTER — Other Ambulatory Visit: Payer: Self-pay | Admitting: Obstetrics & Gynecology

## 2017-02-06 ENCOUNTER — Telehealth: Payer: Self-pay

## 2017-02-06 ENCOUNTER — Ambulatory Visit (INDEPENDENT_AMBULATORY_CARE_PROVIDER_SITE_OTHER): Payer: BLUE CROSS/BLUE SHIELD

## 2017-02-06 DIAGNOSIS — R300 Dysuria: Secondary | ICD-10-CM

## 2017-02-06 DIAGNOSIS — N390 Urinary tract infection, site not specified: Secondary | ICD-10-CM

## 2017-02-06 LAB — POCT URINALYSIS DIPSTICK
APPEARANCE: NORMAL
BILIRUBIN UA: NEGATIVE
Blood, UA: POSITIVE
Glucose, UA: NEGATIVE
KETONES UA: NEGATIVE
Nitrite, UA: NEGATIVE
PROTEIN UA: NEGATIVE
Spec Grav, UA: 1.015 (ref 1.010–1.025)
Urobilinogen, UA: 0.2 E.U./dL
pH, UA: 7 (ref 5.0–8.0)

## 2017-02-06 MED ORDER — CIPROFLOXACIN HCL 500 MG PO TABS
500.0000 mg | ORAL_TABLET | Freq: Two times a day (BID) | ORAL | 3 refills | Status: DC
Start: 2017-02-06 — End: 2017-03-02

## 2017-02-06 NOTE — Telephone Encounter (Signed)
Pt came in and left urine sample Positive for Blood and leukocyets, do you want to send urine culture? Looks like this UTI is a on going issue for pt. Please advise

## 2017-02-06 NOTE — Telephone Encounter (Signed)
Let her know: Cipro twice a day x 2 weeks, then once a day for 3 months Consider urology referral to assess for other causes of UTI [facility]

## 2017-02-06 NOTE — Telephone Encounter (Signed)
Pt aware.

## 2017-02-06 NOTE — Addendum Note (Signed)
Addended by: Cornelius MorasPATTERSON, Luria Rosario D on: 02/06/2017 03:37 PM   Modules accepted: Orders

## 2017-02-09 LAB — URINE CULTURE

## 2017-02-12 NOTE — Progress Notes (Signed)
Let her know Culture positive again for UTI, also that it is sensitive to Cipro.  Continue ABX.  May consider urology referral since this is a continuing problem.

## 2017-02-12 NOTE — Progress Notes (Signed)
Pt would like to be referred somewhere

## 2017-02-12 NOTE — Progress Notes (Signed)
lmtrc

## 2017-02-15 ENCOUNTER — Telehealth: Payer: Self-pay | Admitting: *Deleted

## 2017-02-15 NOTE — Telephone Encounter (Signed)
Please advise 

## 2017-02-15 NOTE — Telephone Encounter (Signed)
Copied from CRM [id number]. Topic: General - Other >> Feb 15, 2017  7:42 AM Oneal GroutSebastian, Jennifer S wrote: Reason for CRM: Patient would like to have a Hep C test. Please advise

## 2017-02-17 NOTE — Telephone Encounter (Signed)
I am happy to order this for her though it has been sometime since she had a physical.  I would suggest she schedule an appointment for a physical and we can order labs at that time.

## 2017-02-18 ENCOUNTER — Other Ambulatory Visit: Payer: Self-pay | Admitting: Obstetrics & Gynecology

## 2017-02-18 DIAGNOSIS — N390 Urinary tract infection, site not specified: Secondary | ICD-10-CM

## 2017-02-18 NOTE — Telephone Encounter (Signed)
Left message to return call, ok for pec to speak to patient and schedule annual

## 2017-02-18 NOTE — Progress Notes (Signed)
ok 

## 2017-02-18 NOTE — Progress Notes (Signed)
Can you refer pt to urologist

## 2017-02-20 ENCOUNTER — Encounter: Payer: Self-pay | Admitting: Family Medicine

## 2017-02-20 ENCOUNTER — Ambulatory Visit: Payer: BLUE CROSS/BLUE SHIELD | Admitting: Family Medicine

## 2017-02-20 VITALS — BP 110/80 | HR 78 | Temp 98.2°F | Ht 65.0 in

## 2017-02-20 DIAGNOSIS — R109 Unspecified abdominal pain: Secondary | ICD-10-CM

## 2017-02-20 DIAGNOSIS — N390 Urinary tract infection, site not specified: Secondary | ICD-10-CM | POA: Diagnosis not present

## 2017-02-20 DIAGNOSIS — R300 Dysuria: Secondary | ICD-10-CM

## 2017-02-20 DIAGNOSIS — Z1159 Encounter for screening for other viral diseases: Secondary | ICD-10-CM | POA: Diagnosis not present

## 2017-02-20 LAB — POCT URINALYSIS DIPSTICK
BILIRUBIN UA: NEGATIVE
Blood, UA: 10
Glucose, UA: NEGATIVE
KETONES UA: NEGATIVE
NITRITE UA: NEGATIVE
PROTEIN UA: NEGATIVE
SPEC GRAV UA: 1.01 (ref 1.010–1.025)
Urobilinogen, UA: 0.2 E.U./dL
pH, UA: 7 (ref 5.0–8.0)

## 2017-02-20 MED ORDER — NITROFURANTOIN MONOHYD MACRO 100 MG PO CAPS: 100.0000 mg | ORAL_CAPSULE | Freq: Two times a day (BID) | ORAL | 0 refills | Status: DC

## 2017-02-20 NOTE — Progress Notes (Signed)
Tommi Rumps, MD Phone: [phone]  Cassandra Malone is a 62 y.o. female who presents today for follow-up.  Patient has had issues with recurrent UTIs for some time now.  Appeared to have all grown Enterococcus faecalis.  The has been on fairly consistent treatment with ciprofloxacin.  Currently taking it twice daily.  Notes continuing to have symptoms.  Some suprapubic cramps and low back discomfort.  No dysuria though does have frequency and urgency.  Worsened yesterday though slightly better today.  She describes mid gastric discomfort that goes to her back that feels as though something is bubbling up that has been occurring with her UTIs for many years.  She does not have those symptoms currently.  Patient also requests hepatitis C testing.  She reports she was watching TV and saw a commercial regarding this and her age group.  She does note a boyfriend that she had in high school who she was sexually active with was a heroin abuser.  She does note to check with her insurance and they stated they would cover hepatitis C testing for preventative measures.  Social History   Tobacco Use  Smoking Status Never Smoker  Smokeless Tobacco Never Used     ROS see history of present illness  Objective  Physical Exam Vitals:   02/20/17 1614  BP: 110/80  Pulse: 78  Temp: 98.2 F (36.8 C)  SpO2: 99%    BP Readings from Last 3 Encounters:  02/20/17 110/80  12/10/16 120/80  11/27/16 120/80   Wt Readings from Last 3 Encounters:  12/10/16 138 lb (62.6 kg)  11/27/16 135 lb (61.2 kg)  07/24/16 134 lb 6.4 oz (61 kg)    Physical Exam  Constitutional: No distress.  Cardiovascular: Normal rate, regular rhythm and normal heart sounds.  Pulmonary/Chest: Effort normal and breath sounds normal.  Abdominal: Soft. Bowel sounds are normal. She exhibits no distension. There is tenderness (Slight suprapubic tenderness). There is no rebound and no guarding.  Musculoskeletal: She exhibits no  edema.  Neurological: She is alert. Gait normal.  Skin: Skin is warm and dry. She is not diaphoretic.     Assessment/Plan: Please see individual problem list.  Recurrent UTI Continues to have issues with recurrent UTIs.  UA concerning for this.  Given that she has been on ciprofloxacin for so long we will have her discontinue this and we will place her on Macrobid.  We will send urine for culture and microscopy.  Given her other vague symptoms in the past with her UTIs will check lab work as outlined below.  She will keep her appointment with urology.  Need for hepatitis C screening test Patient meets criteria for hepatitis C screening.  I did discuss screening for HIV and hepatitis B given her prior sexual activity with someone who used heroin.  She was unsure if her insurance would cover this and opted to defer these to her physical.  She will check with her insurance company to make sure these are covered.  Hepatitis C testing was ordered today.   Orders Placed This Encounter  Procedures  . Urine Culture  . Urine Microscopic  . Comp Met (CMET)  . Lipase  . CBC w/Diff  . Hepatitis C Antibody  . POCT Urinalysis Dipstick    Meds ordered this encounter  Medications  . nitrofurantoin, macrocrystal-monohydrate, (MACROBID) 100 MG capsule    Sig: Take 1 capsule (100 mg total) by mouth 2 (two) times daily.    Dispense:  14 capsule  Refill:  0     Tommi Rumps, MD [place]

## 2017-02-20 NOTE — Assessment & Plan Note (Signed)
Continues to have issues with recurrent UTIs.  UA concerning for this.  Given that she has been on ciprofloxacin for so long we will have her discontinue this and we will place her on Macrobid.  We will send urine for culture and microscopy.  Given her other vague symptoms in the past with her UTIs will check lab work as outlined below.  She will keep her appointment with urology.

## 2017-02-20 NOTE — Progress Notes (Signed)
Pre visit review using our clinic review tool, if applicable. No additional management support is needed unless otherwise documented below in the visit note. 

## 2017-02-20 NOTE — Assessment & Plan Note (Signed)
Patient meets criteria for hepatitis C screening.  I did discuss screening for HIV and hepatitis B given her prior sexual activity with someone who used heroin.  She was unsure if her insurance would cover this and opted to defer these to her physical.  She will check with her insurance company to make sure these are covered.  Hepatitis C testing was ordered today.

## 2017-02-20 NOTE — Patient Instructions (Signed)
Nice to see you. Please see the urologist as planned. We will treat you with Macrobid. We will obtain some lab work today and contact you with results. If you develop worsening abdominal pain or fevers please be evaluated.

## 2017-02-21 ENCOUNTER — Telehealth: Payer: Self-pay | Admitting: Family Medicine

## 2017-02-21 LAB — COMPREHENSIVE METABOLIC PANEL
ALK PHOS: 60 U/L (ref 39–117)
ALT: 19 U/L (ref 0–35)
AST: 24 U/L (ref 0–37)
Albumin: 4.6 g/dL (ref 3.5–5.2)
BILIRUBIN TOTAL: 0.6 mg/dL (ref 0.2–1.2)
BUN: 11 mg/dL (ref 6–23)
CO2: 26 mEq/L (ref 19–32)
CREATININE: 0.7 mg/dL (ref 0.40–1.20)
Calcium: 9.5 mg/dL (ref 8.4–10.5)
Chloride: 103 mEq/L (ref 96–112)
GFR: 90.08 mL/min (ref >60.00)
Glucose, Bld: 81 mg/dL (ref 70–99)
POTASSIUM: 3.6 meq/L (ref 3.5–5.1)
Sodium: 138 mEq/L (ref 135–145)
Total Protein: 7.2 g/dL (ref 6.0–8.3)

## 2017-02-21 LAB — LIPASE: LIPASE: 24 U/L (ref 11.0–59.0)

## 2017-02-21 LAB — CBC WITH DIFFERENTIAL/PLATELET
BASOS ABS: 0 10*3/uL (ref 0.0–0.1)
Basophils Relative: 0.7 % (ref 0.0–3.0)
EOS ABS: 0 10*3/uL (ref 0.0–0.7)
EOS PCT: 0.5 % (ref 0.0–5.0)
HCT: 39 % (ref 36.0–46.0)
Hemoglobin: 13.3 g/dL (ref 12.0–15.0)
LYMPHS ABS: 1.8 10*3/uL (ref 0.7–4.0)
Lymphocytes Relative: 47.2 % — ABNORMAL HIGH (ref 12.0–46.0)
MCHC: 34.1 g/dL (ref 30.0–36.0)
MCV: 88.2 fl (ref 78.0–100.0)
MONO ABS: 0.4 10*3/uL (ref 0.1–1.0)
Monocytes Relative: 10.7 % (ref 3.0–12.0)
NEUTROS PCT: 40.9 % — AB (ref 43.0–77.0)
Neutro Abs: 1.5 10*3/uL (ref 1.4–7.7)
Platelets: 263 10*3/uL (ref 150.0–400.0)
RBC: 4.43 Mil/uL (ref 3.87–5.11)
RDW: 12.7 % (ref 11.5–15.5)
WBC: 3.8 10*3/uL — AB (ref 4.0–10.5)

## 2017-02-21 LAB — URINALYSIS, MICROSCOPIC ONLY

## 2017-02-21 LAB — HEPATITIS C ANTIBODY
HEP C AB: NONREACTIVE
SIGNAL TO CUT-OFF: 0.01 (ref <1.00)

## 2017-02-21 NOTE — Telephone Encounter (Signed)
[facility]; reported this has not worked in the past.  Is requesting one of the following : Ampicillin, Daptomycin, or Linezolid.    Pharmacy: [facility]  on [address] and [address] in [place].

## 2017-02-21 NOTE — Telephone Encounter (Signed)
Given the patient's clinical picture when she was in the office and her stable vital signs there is no indication to do a blood culture at this time.  Will contact her when her urine culture returns.

## 2017-02-21 NOTE — Telephone Encounter (Signed)
Copied from CRM [phone]. Topic: Quick Communication - See Telephone Encounter >> Feb 21, 2017  9:43 AM Arlyss Gandyichardson, Attie Nawabi N, NT wrote: CRM for notification. See Telephone encounter for: Pt calling to see what the bacteria that was found in her urine was called.   02/21/17.

## 2017-02-21 NOTE — Telephone Encounter (Signed)
Patient notified of bacteria from urine culture

## 2017-02-21 NOTE — Telephone Encounter (Signed)
Copied from CRM [phone]. Topic: Quick Communication - See Telephone Encounter >> Feb 21, 2017  1:36 PM Guinevere FerrariMorris, Ashawna Hanback E, NT wrote: CRM for notification. See Telephone encounter for: Patient called and said the doctor prescribed her  nitrofurantoin, macrocrystal-monohydrate, (MACROBID) 100 MG capsule and said she has taken this before and it didn't work. Patient wanted to see if the doctor could call her in one of the three antibiotics ampicillin, daptomycin or linezolid to [phone] [place] - [address] AT [facility] [address] [phone] (Phone) [phone] (Fax)    02/21/17.

## 2017-02-21 NOTE — Telephone Encounter (Signed)
Please advise 

## 2017-02-21 NOTE — Telephone Encounter (Signed)
Patient notified, patient asked if she should have a blood test done to check of the bacteria is in her blood. I spoke to Dr.Sonnenberg and he stated that since she did not have a fever, chills, sweats and acutely ill she did not need the test done at this time. Informed patient of Dr.Sonnenbergs respond and she verbalized understanding.

## 2017-02-21 NOTE — Telephone Encounter (Signed)
There is no indication to change to ampicillin, daptomycin, or linezolid. Her cultures have recently all revealed the same bacteria that is responsive to macrobid. She should proceed with the macrobid and we will contact her with the culture results when they return.

## 2017-02-22 ENCOUNTER — Other Ambulatory Visit: Payer: Self-pay | Admitting: Family Medicine

## 2017-02-22 DIAGNOSIS — D72819 Decreased white blood cell count, unspecified: Secondary | ICD-10-CM

## 2017-02-22 LAB — URINE CULTURE
MICRO NUMBER: 90128136
SPECIMEN QUALITY: ADEQUATE

## 2017-02-26 NOTE — Telephone Encounter (Addendum)
Since pt has been on Macrobid since 02/20/17 skyped flow coordinator(Kathy Earlene PlaterDavis) to see if pt needs OV to do U/A. Per Olegario Messierkathy Dr Birdie SonsSonnenberg ok'd pt to come by and drop off another urine sample and to stop Macrobid. Pt to come by office tomorrow.

## 2017-02-26 NOTE — Telephone Encounter (Signed)
Please advise 

## 2017-02-27 ENCOUNTER — Other Ambulatory Visit (INDEPENDENT_AMBULATORY_CARE_PROVIDER_SITE_OTHER): Payer: BLUE CROSS/BLUE SHIELD

## 2017-02-27 DIAGNOSIS — R300 Dysuria: Secondary | ICD-10-CM

## 2017-02-27 LAB — URINALYSIS, ROUTINE W REFLEX MICROSCOPIC
Bilirubin Urine: NEGATIVE
HGB URINE DIPSTICK: NEGATIVE
KETONES UR: NEGATIVE
NITRITE: NEGATIVE
Specific Gravity, Urine: <=1.005 — AB (ref 1.000–1.030)
Total Protein, Urine: NEGATIVE
URINE GLUCOSE: NEGATIVE
UROBILINOGEN UA: 0.2 (ref 0.0–1.0)
pH: 7 (ref 5.0–8.0)

## 2017-02-27 NOTE — Progress Notes (Signed)
Urine test orders placed. Pt was told to drop off a urine today.

## 2017-03-01 ENCOUNTER — Other Ambulatory Visit: Payer: Self-pay | Admitting: Family Medicine

## 2017-03-01 LAB — URINE CULTURE
MICRO NUMBER:: 90160677
SPECIMEN QUALITY:: ADEQUATE

## 2017-03-02 ENCOUNTER — Telehealth: Payer: Self-pay | Admitting: Family Medicine

## 2017-03-02 MED ORDER — NITROFURANTOIN MONOHYD MACRO 100 MG PO CAPS: 100.0000 mg | ORAL_CAPSULE | Freq: Two times a day (BID) | ORAL | 0 refills | Status: DC

## 2017-03-02 NOTE — Telephone Encounter (Signed)
Spoke with patient regarding urine culture. She noted return of symptoms about 2-3 days after stopping the antibiotic previously. We will treat again with macrobid given sensitivities. She will see urology later this month as planned. Macrobid sent to pharmacy.

## 2017-03-07 ENCOUNTER — Telehealth: Payer: Self-pay | Admitting: Radiology

## 2017-03-07 ENCOUNTER — Other Ambulatory Visit (INDEPENDENT_AMBULATORY_CARE_PROVIDER_SITE_OTHER): Payer: BLUE CROSS/BLUE SHIELD

## 2017-03-07 DIAGNOSIS — D72819 Decreased white blood cell count, unspecified: Secondary | ICD-10-CM

## 2017-03-07 LAB — CBC WITH DIFFERENTIAL/PLATELET
BASOS ABS: 0 10*3/uL (ref 0.0–0.1)
BASOS PCT: 0.8 % (ref 0.0–3.0)
EOS ABS: 0 10*3/uL (ref 0.0–0.7)
Eosinophils Relative: 0.8 % (ref 0.0–5.0)
HEMATOCRIT: 39.1 % (ref 36.0–46.0)
Hemoglobin: 13.1 g/dL (ref 12.0–15.0)
LYMPHS PCT: 38.4 % (ref 12.0–46.0)
Lymphs Abs: 1.4 10*3/uL (ref 0.7–4.0)
MCHC: 33.5 g/dL (ref 30.0–36.0)
MCV: 88.6 fl (ref 78.0–100.0)
MONO ABS: 0.4 10*3/uL (ref 0.1–1.0)
Monocytes Relative: 10.2 % (ref 3.0–12.0)
NEUTROS ABS: 1.8 10*3/uL (ref 1.4–7.7)
NEUTROS PCT: 49.8 % (ref 43.0–77.0)
PLATELETS: 268 10*3/uL (ref 150.0–400.0)
RBC: 4.42 Mil/uL (ref 3.87–5.11)
RDW: 12.8 % (ref 11.5–15.5)
WBC: 3.7 10*3/uL — AB (ref 4.0–10.5)

## 2017-03-07 NOTE — Telephone Encounter (Signed)
Pt came in for labs today. Verified pt name and DOB. Asked pt which arm she would like to use. Pt stated right arm. When needle entered pt vein pt jerked extremely bad and pt left knee kicked arm rest on lab chair. Arm rest on lab chair raised up causing lab staff arm to move and misplace needle. Advised pt that she had jerked so bad causing needle to be taken out of vein. PT shouted "So you got to do it again?!" Advised pt that she would need to be stuck again to obtain blood. PT loudly stated, "Oh so this is my fault?!" Pt was stuck in left arm with successful stick. Pt had Band-Aids placed on both arms.

## 2017-03-08 ENCOUNTER — Ambulatory Visit: Payer: Self-pay

## 2017-03-14 ENCOUNTER — Ambulatory Visit: Payer: BLUE CROSS/BLUE SHIELD | Admitting: Urology

## 2017-03-14 ENCOUNTER — Encounter: Payer: Self-pay | Admitting: Urology

## 2017-03-14 VITALS — BP 143/88 | HR 96 | Ht 65.0 in | Wt 142.9 lb

## 2017-03-14 DIAGNOSIS — N390 Urinary tract infection, site not specified: Secondary | ICD-10-CM | POA: Diagnosis not present

## 2017-03-14 LAB — URINALYSIS, COMPLETE
Bilirubin, UA: NEGATIVE
GLUCOSE, UA: NEGATIVE
Ketones, UA: NEGATIVE
NITRITE UA: NEGATIVE
PH UA: 7 (ref 5.0–7.5)
PROTEIN UA: NEGATIVE
Specific Gravity, UA: 1.015 (ref 1.005–1.030)
Urobilinogen, Ur: 0.2 mg/dL (ref 0.2–1.0)

## 2017-03-14 LAB — MICROSCOPIC EXAMINATION

## 2017-03-14 NOTE — Progress Notes (Signed)
03/14/2017 1:51 PM   Cassandra Malone [birth year] [id number]  Referring provider: Glori Luis, MD [address] [place], [place] [address]  Chief Complaint  Patient presents with  . Recurrent UTI    HPI: The patient is a 62 year old female who presents today for evaluation of recurrent urinary tract infections.  Review of her records shows that she has had 3 separate enterococcus urinary tract infections since mid December 2018.  She states that this is been the frequency that she has UTIs since approximately 2006.  She has previously seen Dr. Evelene Croon for this.  He had started her on vaginal estrogen cream which the patient when she she still uses today.  Has UTIs has symptoms that include bloating, suprapubic discomfort, pain, and dysuria.  These resolve when she takes antibiotics.  Her cultures have proven to be positive.  Again every time it has been enterococcus.  She has no recent renal imaging.  She has no history of nephrolithiasis.  She has no history of hematuria.   PMH: Past Medical History:  Diagnosis Date  . Allergy    food, never tested  . Arthritis   . Complication of anesthesia   . Family history of adverse reaction to anesthesia    Adopted  . Ovarian cyst   . PONV (postoperative nausea and vomiting)   . UTI (urinary tract infection)     Surgical History: Past Surgical History:  Procedure Laterality Date  . CHONDROPLASTY Left 08/24/2015   Procedure: CHONDROPLASTY;  Surgeon: Donato Heinz, MD;  Location: [facility] ORS;  Service: Orthopedics;  Laterality: Left;  . KNEE ARTHROSCOPY WITH LATERAL MENISECTOMY Left 08/24/2015   Procedure: KNEE ARTHROSCOPY WITH LATERAL MENISECTOMY;  Surgeon: Donato Heinz, MD;  Location: [facility] ORS;  Service: Orthopedics;  Laterality: Left;  . KNEE ARTHROSCOPY WITH MEDIAL MENISECTOMY Left 05/27/2014   Procedure: KNEE ARTHROSCOPY WITH  PARTIAL MEDIAL MENISECTOMY, SYNOVECTOMY LEFT;  Surgeon: Kennedy Bucker, MD;  Location: [facility] ORS;   Service: Orthopedics;  Laterality: Left;  . KNEE ARTHROSCOPY WITH MEDIAL MENISECTOMY Left 08/24/2015   Procedure: KNEE ARTHROSCOPY WITH MEDIAL MENISECTOMY;  Surgeon: Donato Heinz, MD;  Location: [facility] ORS;  Service: Orthopedics;  Laterality: Left;  . OPEN REDUCTION INTERNAL FIXATION (ORIF) DISTAL RADIAL FRACTURE Right 11/05/2016   Procedure: OPEN REDUCTION INTERNAL FIXATION (ORIF) DISTAL RADIAL FRACTURE;  Surgeon: Bradly Bienenstock, MD;  Location: [facility];  Service: Orthopedics;  Laterality: Right;    Home Medications:  Allergies as of 03/14/2017      Reactions   Latex Rash   Advises "one time itching with I think the gloves at the gynecologist's office" 11-05-16 PT DENIES ALLERGY TO LATEX      Medication List        Accurate as of 03/14/17  1:51 PM. Always use your most recent med list.          BOSWELLIA PO Take by mouth.   cholecalciferol 1000 units tablet Commonly known as:  VITAMIN D Take 1,000 Units by mouth 2 (two) times daily.   co-enzyme Q-10 50 MG capsule Take 100 mg by mouth daily.   COLLAGEN PO Take 1,000 mg by mouth.   Cranberry 1000 MG Caps Take by mouth.   glucosamine-chondroitin 500-400 MG tablet Take 1 tablet by mouth 3 (three) times daily.   HAIR VITAMINS PO Take 1 capsule by mouth 2 (two) times daily.   L-Theanine 100 MG Caps Take 1 capsule by mouth 2 (two) times daily.   Magnesium 250 MG  Tabs Take by mouth.   naproxen sodium 220 MG tablet Commonly known as:  ALEVE Take 220 mg by mouth daily as needed (pain).   Nattokinase 100 MG Caps Take 100 mg by mouth 2 (two) times daily.   OVER THE COUNTER MEDICATION Take 1 tablet by mouth daily. Relora   OVER THE COUNTER MEDICATION Take 1 tablet by mouth daily. DMAE   OVER THE COUNTER MEDICATION Take 1 tablet by mouth daily. glycine   PHOSPHATIDYL CHOLINE PO Take 420 mg by mouth.   Vinpocetine Powd Take 1 Dose by mouth 2 (two) times daily.   vitamin C 1000 MG tablet Take 1,000 mg by mouth  daily.       Allergies:  Allergies  Allergen Reactions  . Latex Rash    Advises "one time itching with I think the gloves at the gynecologist's office" 11-05-16 PT DENIES ALLERGY TO LATEX    Family History: Family History  Adopted: Yes  Problem Relation Age of Onset  . Bladder Cancer Neg Hx   . Kidney cancer Neg Hx     Social History:  reports that  has never smoked. she has never used smokeless tobacco. She reports that she drinks about 0.6 oz of alcohol per week. She reports that she does not use drugs.  ROS: UROLOGY Frequent Urination?: Yes Hard to postpone urination?: No Burning/pain with urination?: No Get up at night to urinate?: No Leakage of urine?: No Urine stream starts and stops?: No Trouble starting stream?: No Do you have to strain to urinate?: No Blood in urine?: No Urinary tract infection?: Yes Sexually transmitted disease?: No Injury to kidneys or bladder?: No Painful intercourse?: No Weak stream?: No Currently pregnant?: No Vaginal bleeding?: No Last menstrual period?: n  Gastrointestinal Nausea?: No Vomiting?: No Indigestion/heartburn?: No Diarrhea?: No Constipation?: No  Constitutional Fever: No Night sweats?: No Weight loss?: No Fatigue?: No  Skin Skin rash/lesions?: No Itching?: No  Eyes Blurred vision?: No Double vision?: No  Ears/Nose/Throat Sore throat?: No Sinus problems?: No  Hematologic/Lymphatic Swollen glands?: No Easy bruising?: No  Cardiovascular Leg swelling?: No Chest pain?: No  Respiratory Cough?: No Shortness of breath?: No  Endocrine Excessive thirst?: No  Musculoskeletal Back pain?: No Joint pain?: Yes  Neurological Headaches?: No Dizziness?: No  Psychologic Depression?: No Anxiety?: No  Physical Exam: BP (!) 143/88 (BP Location: Right Arm, Patient Position: Sitting, Cuff Size: Normal)   Pulse 96   Ht 5\' 5"  (1.651 m)   Wt 142 lb 14.4 oz (64.8 kg)   BMI 23.78 kg/m     Constitutional:  Alert and oriented, No acute distress. HEENT: [place] AT, moist mucus membranes.  Trachea midline, no masses. Cardiovascular: No clubbing, cyanosis, or edema. Respiratory: Normal respiratory effort, no increased work of breathing. GI: Abdomen is soft, nontender, nondistended, no abdominal masses GU: No CVA tenderness.  Skin: No rashes, bruises or suspicious lesions. Lymph: No cervical or inguinal adenopathy. Neurologic: Grossly intact, no focal deficits, moving all 4 extremities. Psychiatric: Normal mood and affect.  Laboratory Data: Lab Results  Component Value Date   WBC 3.7 (L) 03/07/2017   HGB 13.1 03/07/2017   HCT 39.1 03/07/2017   MCV 88.6 03/07/2017   PLT 268.0 03/07/2017    Lab Results  Component Value Date   CREATININE 0.70 02/20/2017    No results found for: PSA  No results found for: TESTOSTERONE  No results found for: HGBA1C  Urinalysis    Component Value Date/Time   COLORURINE YELLOW 02/27/2017 1450  APPEARANCEUR Clear 03/14/2017 1320   LABSPEC <=1.005 (A) 02/27/2017 1450   PHURINE 7.0 02/27/2017 1450   GLUCOSEU Negative 03/14/2017 1320   GLUCOSEU NEGATIVE 02/27/2017 1450   HGBUR NEGATIVE 02/27/2017 1450   BILIRUBINUR Negative 03/14/2017 1320   KETONESUR NEGATIVE 02/27/2017 1450   PROTEINUR Negative 03/14/2017 1320   UROBILINOGEN 0.2 02/27/2017 1450   NITRITE Negative 03/14/2017 1320   NITRITE NEGATIVE 02/27/2017 1450   LEUKOCYTESUR 1+ (A) 03/14/2017 1320    Assessment & Plan:    1. Recurrent UTI The patient has a long-standing history of recurrent urinary tract infections that have been culture proven.  She already is on vaginal estrogen cream which does not seem to be helping.  We will have her undergo a renal ultrasound followed by a pelvic exam and office cystoscopy.  If this workup is negative, she may be benefit from a low-dose suppressive antibiotic.  Return for after renal u/s for cysto/pelvic exam.  Hildred Laser,  MD  [facility] [address] [place], [place] [address] [phone]

## 2017-03-19 ENCOUNTER — Telehealth: Payer: Self-pay | Admitting: Urology

## 2017-03-19 NOTE — Telephone Encounter (Signed)
Pt is very concerned about her ultrasound that has been ordered. Pt wants to know if this is going to be inside of her or outside and exactly what are you looking for and why is this US ordered for UTI.  Pt states she just had this done about 2 mos ago and [facility]. Pt would like for Dr. Sherryl BartersBudzyn or his nurse to call her to answer these questions before she agrees to scheduling.  Seems to be very anxious and concerned about the whole process, including Cysto.  Please advise. [phone]. Thanks.

## 2017-03-22 NOTE — Telephone Encounter (Signed)
LMOM

## 2017-03-22 NOTE — Telephone Encounter (Signed)
Pt returned your call. Please call her back. Thanks 

## 2017-03-26 ENCOUNTER — Ambulatory Visit
Admission: RE | Admit: 2017-03-26 | Discharge: 2017-03-26 | Disposition: A | Discharge status: Home / Self Care | Payer: BLUE CROSS/BLUE SHIELD | Source: Ambulatory Visit | Attending: Urology | Admitting: Urology

## 2017-03-26 DIAGNOSIS — N390 Urinary tract infection, site not specified: Secondary | ICD-10-CM | POA: Diagnosis not present

## 2017-04-01 NOTE — Telephone Encounter (Signed)
Spoke with patient she has already had the U/S and will keep cysto apt

## 2017-04-04 ENCOUNTER — Other Ambulatory Visit: Payer: BLUE CROSS/BLUE SHIELD

## 2017-04-05 ENCOUNTER — Encounter: Payer: Self-pay | Admitting: Urology

## 2017-04-05 ENCOUNTER — Telehealth: Payer: Self-pay | Admitting: Urology

## 2017-04-05 ENCOUNTER — Ambulatory Visit (INDEPENDENT_AMBULATORY_CARE_PROVIDER_SITE_OTHER): Payer: BLUE CROSS/BLUE SHIELD | Admitting: Urology

## 2017-04-05 VITALS — BP 138/88 | HR 74 | Ht 65.0 in | Wt 144.0 lb

## 2017-04-05 DIAGNOSIS — N390 Urinary tract infection, site not specified: Secondary | ICD-10-CM

## 2017-04-05 LAB — URINALYSIS, COMPLETE
Bilirubin, UA: NEGATIVE
GLUCOSE, UA: NEGATIVE
Ketones, UA: NEGATIVE
NITRITE UA: NEGATIVE
PROTEIN UA: NEGATIVE
Specific Gravity, UA: 1.01 (ref 1.005–1.030)
Urobilinogen, Ur: 0.2 mg/dL (ref 0.2–1.0)
pH, UA: 7 (ref 5.0–7.5)

## 2017-04-05 LAB — MICROSCOPIC EXAMINATION: WBC, UA: NONE SEEN /hpf (ref 0–?)

## 2017-04-05 MED ORDER — NITROFURANTOIN MONOHYD MACRO 100 MG PO CAPS: 100.0000 mg | ORAL_CAPSULE | Freq: Every day | ORAL | 11 refills | Status: AC

## 2017-04-05 NOTE — Telephone Encounter (Signed)
FYI -- Pt refused to make 3 mos follow up appt. States she will call make appt as needed.

## 2017-04-05 NOTE — Progress Notes (Signed)
   04/05/17  CC: No chief complaint on file.   HPI: The patient is a 62 year old female who presents today for evaluation of recurrent urinary tract infections.  Review of her records shows that she has had 3 separate enterococcus urinary tract infections since mid December 2018.  She states that this has been the frequency that she has UTIs since approximately 2006.  She has previously seen Dr. Evelene CroonWolff for this.  He had started her on vaginal estrogen cream which the patient which she she still uses today.  Has UTIs has symptoms include bloating, suprapubic discomfort, pain, and dysuria.  These resolve when she takes antibiotics.  Her cultures have proven to be positive.  Again every time it has been pan sensitive enterococcus.  She has no recent renal imaging.  She has no history of nephrolithiasis.  She has no history of hematuria.  She underwent a renal ultrasound which showed no abnormalities including no stones.   There were no vitals taken for this visit. NED. A&Ox3.   No respiratory distress   Abd soft, NT, ND Normal external genitalia with patent urethral meatus  Cystoscopy Procedure Note  Patient identification was confirmed, informed consent was obtained, and patient was prepped using Betadine solution.  Lidocaine jelly was administered per urethral meatus.    Preoperative abx where received prior to procedure.    Procedure: - Flexible cystoscope introduced, without any difficulty.   - Thorough search of the bladder revealed:    normal urethral meatus    normal urothelium    no stones    no ulcers     no tumors    no urethral polyps    no trabeculation  - Ureteral orifices were normal in position and appearance.  Vaginal mucosa well estrogenized.  Post-Procedure: - Patient tolerated the procedure well  Assessment/ Plan:  1.  Recurrent UTI The patient has failed vaginal estrogen cream for her recurrent UTIs even though her vaginal mucosa is well estrogenized on  exam.  Her UTIs have been pansensitive to antibiotic therapy.  Therefore, we will start her on prophylactic nightly nitrofurantoin 100 mg daily.  Hildred LaserBrian James Annikah Lovins, MD

## 2017-04-08 ENCOUNTER — Telehealth: Payer: Self-pay

## 2017-04-08 NOTE — Telephone Encounter (Signed)
Pt called questioning macrobid dosage. Reinforced with pt that macrobid is given once a day for prophylaxis and twice a day for treating. Reinforced with pt if not comfortable does not have to take macrobid. Pt stated that she was originally told not to take abx unless having UTI s/s. Reinforced with pt the difference. Pt voiced understanding.

## 2017-04-16 ENCOUNTER — Other Ambulatory Visit: Payer: Self-pay

## 2017-04-16 ENCOUNTER — Ambulatory Visit: Payer: BLUE CROSS/BLUE SHIELD | Admitting: Family Medicine

## 2017-04-16 ENCOUNTER — Encounter: Payer: Self-pay | Admitting: Family Medicine

## 2017-04-16 VITALS — BP 134/88 | HR 82 | Temp 98.8°F

## 2017-04-16 DIAGNOSIS — Z0001 Encounter for general adult medical examination with abnormal findings: Secondary | ICD-10-CM

## 2017-04-16 DIAGNOSIS — D72819 Decreased white blood cell count, unspecified: Secondary | ICD-10-CM

## 2017-04-16 DIAGNOSIS — Z13220 Encounter for screening for lipoid disorders: Secondary | ICD-10-CM

## 2017-04-16 DIAGNOSIS — Z1211 Encounter for screening for malignant neoplasm of colon: Secondary | ICD-10-CM | POA: Diagnosis not present

## 2017-04-16 DIAGNOSIS — L989 Disorder of the skin and subcutaneous tissue, unspecified: Secondary | ICD-10-CM | POA: Diagnosis not present

## 2017-04-16 NOTE — Progress Notes (Signed)
Tommi Rumps, MD Phone: [phone]  Cassandra Malone is a 62 y.o. female who presents today for CPE.   Exercises by going to the gym for 1.5 hours 3 days a week and exercises home. She eats fairly healthfully with fruits and salads and vegetables.  Not much animal protein. She reports possibly having had a Pap smear in the last couple of years.  We will need to request records from her gynecologist. She declines HIV testing, tetanus vaccination, flu vaccination, and Shingrix. Mammogram is up-to-date. She declines colonoscopy though would consider cologuard. Hepatitis C testing up-to-date. No tobacco use or illicit drug use.  Rare alcohol use.  She reports nonhealing lesion on her left nasal bridge that is been greater than 6 months.  Also an area on her right neck that appears to be a slight scab with the scar that has not been healing over the last 6 months either.  She does not have a dermatologist.  Active Ambulatory Problems    Diagnosis Date Noted  . Food allergy 04/23/2012  . Arthritis 04/23/2012  . Encounter for general adult medical examination with abnormal findings 04/23/2012  . Recurrent UTI 07/24/2016  . Left lower quadrant pain 12/10/2016  . Need for hepatitis C screening test 02/20/2017  . Skin lesion 04/16/2017   Resolved Ambulatory Problems    Diagnosis Date Noted  . Screening for breast cancer 04/23/2012  . Special screening for malignant neoplasms, colon 04/23/2012  . Polypharmacy 04/23/2012  . UTI (urinary tract infection) 10/02/2012  . Hematuria 10/09/2012  . Right flank pain 10/09/2012  . Left ovarian cyst 11/20/2012   Past Medical History:  Diagnosis Date  . Allergy   . Arthritis   . Complication of anesthesia   . Family history of adverse reaction to anesthesia   . Ovarian cyst   . PONV (postoperative nausea and vomiting)   . UTI (urinary tract infection)     Family History  Adopted: Yes  Problem Relation Age of Onset  . Bladder Cancer Neg  Hx   . Kidney cancer Neg Hx     Social History   Socioeconomic History  . Marital status: Married    Spouse name: Not on file  . Number of children: Not on file  . Years of education: Not on file  . Highest education level: Not on file  Occupational History  . Not on file  Social Needs  . Financial resource strain: Not on file  . Food insecurity:    Worry: Not on file    Inability: Not on file  . Transportation needs:    Medical: Not on file    Non-medical: Not on file  Tobacco Use  . Smoking status: Never Smoker  . Smokeless tobacco: Never Used  Substance and Sexual Activity  . Alcohol use: Yes    Alcohol/week: 0.6 oz    Types: 1 Glasses of wine per week  . Drug use: No  . Sexual activity: Not on file  Lifestyle  . Physical activity:    Days per week: Not on file    Minutes per session: Not on file  . Stress: Not on file  Relationships  . Social connections:    Talks on phone: Not on file    Gets together: Not on file    Attends religious service: Not on file    Active member of club or organization: Not on file    Attends meetings of clubs or organizations: Not on file    Relationship  status: Not on file  . Intimate partner violence:    Fear of current or ex partner: Not on file    Emotionally abused: Not on file    Physically abused: Not on file    Forced sexual activity: Not on file  Other Topics Concern  . Not on file  Social History Narrative   Lives in [place] with husband. No children. Dog/cat.      Work - Engineer, building services      Diet - mostly vegetarian diet x chicken      Exercise - 3 days per week at [employer], treadmill    ROS  General:  Negative for nexplained weight loss, fever Skin: Negative for new or changing mole, positive for sore that won't heal HEENT: Negative for trouble hearing, trouble seeing, ringing in ears, mouth sores, hoarseness, change in voice, dysphagia. CV:  Negative for chest pain, dyspnea, edema,  palpitations Resp: Negative for cough, dyspnea, hemoptysis GI: Negative for nausea, vomiting, diarrhea, constipation, abdominal pain, melena, hematochezia. GU: Negative for dysuria, incontinence, urinary hesitance, hematuria, vaginal or penile discharge, polyuria, sexual difficulty, lumps in testicle or breasts MSK: Negative for muscle cramps or aches, joint pain or swelling Neuro: Negative for headaches, weakness, numbness, dizziness, passing out/fainting Psych: Negative for depression, anxiety, memory problems  Objective  Physical Exam Vitals:   04/16/17 1554  BP: 134/88  Pulse: 82  Temp: 98.8 F (37.1 C)  SpO2: 98%  Patient declined having her weight and height checked  BP Readings from Last 3 Encounters:  04/16/17 134/88  04/05/17 138/88  03/14/17 (!) 143/88   Wt Readings from Last 3 Encounters:  04/05/17 144 lb (65.3 kg)  03/14/17 142 lb 14.4 oz (64.8 kg)  12/10/16 138 lb (62.6 kg)    Physical Exam  Constitutional: No distress.  HENT:  Head: Normocephalic and atraumatic.    Mouth/Throat: Oropharynx is clear and moist. No oropharyngeal exudate.  Eyes: Pupils are equal, round, and reactive to light. Conjunctivae are normal.  Neck: Neck supple.  Cardiovascular: Normal rate, regular rhythm and normal heart sounds.  Pulmonary/Chest: Effort normal and breath sounds normal.  Abdominal: Soft. Bowel sounds are normal. She exhibits no distension. There is no tenderness. There is no rebound and no guarding.  Genitourinary:  Genitourinary Comments: Patient deferred pelvic exam, bilateral breasts with no masses, tenderness, skin changes, or nipple inversion, no axillary masses bilaterally  Musculoskeletal: She exhibits no edema.  Lymphadenopathy:    She has no cervical adenopathy.  Neurological: She is alert. Gait normal.  Skin: Skin is warm and dry. She is not diaphoretic.  Psychiatric: Mood and affect normal.     Assessment/Plan:   Encounter for general adult medical  examination with abnormal findings Physical exam completed.  She will continue diet and exercise.  We will request Pap smear results.  She declined vaccinations today.  She will return for fasting labs.  She declines colonoscopy though she is willing to do cologuard.  She is adopted and does not know her family history.  She is not passing blood in her stool.  Skin lesion 2 nonhealing skin lesions.  Will refer to dermatology for evaluation.   Orders Placed This Encounter  Procedures  . Cologuard  . Lipid panel    Standing Status:   Future    Standing Expiration Date:   04/17/2018  . Comp Met (CMET)    Standing Status:   Future    Standing Expiration Date:   04/17/2018  . CBC w/Diff  Standing Status:   Future    Standing Expiration Date:   04/17/2018  . Ambulatory referral to Dermatology    Referral Priority:   Routine    Referral Type:   Consultation    Referral Reason:   Specialty Services Required    Requested Specialty:   Dermatology    Number of Visits Requested:   1    No orders of the defined types were placed in this encounter.    Tommi Rumps, MD [place]

## 2017-04-16 NOTE — Assessment & Plan Note (Signed)
2 nonhealing skin lesions.  Will refer to dermatology for evaluation.

## 2017-04-16 NOTE — Assessment & Plan Note (Addendum)
Physical exam completed.  She will continue diet and exercise.  We will request Pap smear results.  She declined vaccinations today.  She will return for fasting labs.  She declines colonoscopy though she is willing to do cologuard.  She is adopted and does not know her family history.  She is not passing blood in her stool.

## 2017-04-16 NOTE — Patient Instructions (Signed)
Nice to see you. Please stay active and monitor your diet. We will check on your prior Pap smear results.  If you do not hear anything from us in the next 2-3 weeks please contact us. Please contact your insurance company regarding cologuard to make sure they cover this.  This will be sent to your house. You will be referred to dermatology and if you do not hear anything in the next several weeks please contact us as well.

## 2017-04-18 ENCOUNTER — Encounter: Payer: Self-pay | Admitting: Family Medicine

## 2017-04-22 ENCOUNTER — Encounter: Payer: Self-pay | Admitting: Family Medicine

## 2017-04-22 NOTE — Progress Notes (Signed)
Correction: Diabetic eye exam entered in error.

## 2017-04-26 ENCOUNTER — Other Ambulatory Visit: Payer: BLUE CROSS/BLUE SHIELD

## 2017-07-02 ENCOUNTER — Telehealth: Payer: Self-pay

## 2017-07-02 NOTE — Telephone Encounter (Signed)
I am not sure I can change how it was coded given that the referral was made for evaluation of a specific diagnosis.  I will forward to BradfordVanessa as well to see if she can check into it to see if I can change the coding.  Please make sure Erie NoeVanessa sees this.  Thanks.

## 2017-07-02 NOTE — Telephone Encounter (Signed)
Please advise 

## 2017-07-02 NOTE — Telephone Encounter (Signed)
Copied from CRM [phone]. Topic: General - Other >> Jul 02, 2017  3:20 PM Elliot GaultBell, Tiffany M wrote: Relation to pt: self Call back number: [phone]  Reason for call:  Insurance informed patient if Dermatology referral was coded as "preventative" insurance will cover. Patient scheduled dermatology appointment for Thursday 07/04/17  but will cancel appointment until she hears from nurse, please advise >> Jul 02, 2017  3:26 PM Elliot GaultBell, Tiffany M wrote: Relation to pt: self Call back number: [phone]  Reason for call:  Insurance informed patient if Dermatology referral was coded as "preventative" insurance will cover. Patient scheduled dermatology appointment for Thursday 07/04/17  but will cancel appointment until she hears from nurse, please advise

## 2017-07-03 NOTE — Telephone Encounter (Signed)
Left message to return call, ok for pec to speak to patient about message below 

## 2017-07-03 NOTE — Telephone Encounter (Signed)
When the patients get to dermatology and they locate the lesions they are going to bill for the lesions not a screening. Therefore the dx code that was applied to the referral is correct .

## 2017-07-08 NOTE — Telephone Encounter (Signed)
Patient did not return call.

## 2018-04-21 ENCOUNTER — Encounter: Payer: BLUE CROSS/BLUE SHIELD | Admitting: Family Medicine

## 2018-07-30 ENCOUNTER — Encounter: Payer: BLUE CROSS/BLUE SHIELD | Admitting: Family Medicine

## 2021-02-27 ENCOUNTER — Other Ambulatory Visit: Payer: Self-pay | Admitting: Optometry

## 2021-02-27 ENCOUNTER — Other Ambulatory Visit: Payer: Self-pay | Admitting: Family Medicine

## 2021-02-27 DIAGNOSIS — Z1231 Encounter for screening mammogram for malignant neoplasm of breast: Secondary | ICD-10-CM

## 2021-03-03 ENCOUNTER — Other Ambulatory Visit: Payer: Self-pay

## 2021-03-03 ENCOUNTER — Ambulatory Visit
Admission: RE | Admit: 2021-03-03 | Discharge: 2021-03-03 | Disposition: A | Discharge status: Home / Self Care | Payer: Medicare Other | Source: Ambulatory Visit | Attending: Family Medicine | Admitting: Family Medicine

## 2021-03-03 DIAGNOSIS — Z1231 Encounter for screening mammogram for malignant neoplasm of breast: Secondary | ICD-10-CM | POA: Diagnosis present

## 2021-09-06 ENCOUNTER — Ambulatory Visit: Payer: BLUE CROSS/BLUE SHIELD | Admitting: Dermatology

## 2021-12-05 ENCOUNTER — Ambulatory Visit: Payer: BLUE CROSS/BLUE SHIELD | Admitting: Dermatology

## 2022-01-31 ENCOUNTER — Encounter: Payer: Self-pay | Admitting: Ophthalmology

## 2022-02-08 NOTE — Discharge Instructions (Signed)

## 2022-02-09 ENCOUNTER — Encounter: Payer: Self-pay | Admitting: Ophthalmology

## 2022-02-09 ENCOUNTER — Ambulatory Visit: Payer: Medicare Other | Admitting: Anesthesiology

## 2022-02-09 ENCOUNTER — Ambulatory Visit
Admission: RE | Admit: 2022-02-09 | Discharge: 2022-02-09 | Disposition: A | Discharge status: Home / Self Care | Payer: Medicare Other | Attending: Ophthalmology | Admitting: Ophthalmology

## 2022-02-09 ENCOUNTER — Encounter
Admission: RE | Disposition: A | Discharge status: Home / Self Care | Payer: Self-pay | Source: Home / Self Care | Attending: Ophthalmology

## 2022-02-09 ENCOUNTER — Other Ambulatory Visit: Payer: Self-pay

## 2022-02-09 DIAGNOSIS — H02403 Unspecified ptosis of bilateral eyelids: Secondary | ICD-10-CM | POA: Diagnosis present

## 2022-02-09 DIAGNOSIS — H02831 Dermatochalasis of right upper eyelid: Secondary | ICD-10-CM | POA: Insufficient documentation

## 2022-02-09 DIAGNOSIS — H02834 Dermatochalasis of left upper eyelid: Secondary | ICD-10-CM | POA: Diagnosis not present

## 2022-02-09 HISTORY — PX: BROW LIFT: SHX178

## 2022-02-09 HISTORY — DX: Presence of external hearing-aid: Z97.4

## 2022-02-09 SURGERY — BLEPHAROPLASTY
Anesthesia: Monitor Anesthesia Care | Site: Eye | Laterality: Bilateral

## 2022-02-09 MED ORDER — TRAMADOL HCL 50 MG PO TABS: ORAL_TABLET | ORAL | 0 refills | Status: AC

## 2022-02-09 MED ORDER — FENTANYL CITRATE (PF) 100 MCG/2ML IJ SOLN
INTRAMUSCULAR | Status: DC | PRN
  Administered 2022-02-09 (×2): 25 ug via INTRAVENOUS
  Administered 2022-02-09: 50 ug via INTRAVENOUS

## 2022-02-09 MED ORDER — MIDAZOLAM HCL 2 MG/2ML IJ SOLN
INTRAMUSCULAR | Status: DC | PRN
  Administered 2022-02-09: 2 mg via INTRAVENOUS

## 2022-02-09 MED ORDER — LIDOCAINE-EPINEPHRINE 2 %-1:100000 IJ SOLN
INTRAMUSCULAR | Status: DC | PRN
  Administered 2022-02-09: .5 mL via OPHTHALMIC
  Administered 2022-02-09: 1.5 mL via OPHTHALMIC
  Administered 2022-02-09: 2 mL via OPHTHALMIC

## 2022-02-09 MED ORDER — LACTATED RINGERS IV SOLN: INTRAVENOUS | Status: DC

## 2022-02-09 MED ORDER — TETRACAINE HCL 0.5 % OP SOLN
OPHTHALMIC | Status: DC | PRN
  Administered 2022-02-09 (×2): 1 [drp] via OPHTHALMIC

## 2022-02-09 MED ORDER — PROPOFOL 500 MG/50ML IV EMUL
INTRAVENOUS | Status: DC | PRN
  Administered 2022-02-09: 25 ug via INTRAVENOUS
  Administered 2022-02-09: 50 ug via INTRAVENOUS
  Administered 2022-02-09 (×7): 25 ug via INTRAVENOUS

## 2022-02-09 MED ORDER — ERYTHROMYCIN 5 MG/GM OP OINT
TOPICAL_OINTMENT | OPHTHALMIC | Status: DC | PRN
  Administered 2022-02-09: 1 via OPHTHALMIC

## 2022-02-09 MED ORDER — ERYTHROMYCIN 5 MG/GM OP OINT: TOPICAL_OINTMENT | OPHTHALMIC | 2 refills | Status: AC

## 2022-02-09 MED ORDER — BSS IO SOLN
INTRAOCULAR | Status: DC | PRN
  Administered 2022-02-09: 15 mL

## 2022-02-09 SURGICAL SUPPLY — 22 items
APPLICATOR COTTON TIP WD 3 STR (MISCELLANEOUS) ×1 IMPLANT
BLADE SURG 15 STRL LF DISP TIS (BLADE) ×1 IMPLANT
BLADE SURG 15 STRL SS (BLADE) ×1
CORD BIP STRL DISP 12FT (MISCELLANEOUS) ×1 IMPLANT
GAUZE SPONGE 4X4 12PLY STRL (GAUZE/BANDAGES/DRESSINGS) ×1 IMPLANT
GLOVE SURG UNDER POLY LF SZ7 (GLOVE) ×2 IMPLANT
GOWN STRL REUS W/ TWL LRG LVL3 (GOWN DISPOSABLE) ×1 IMPLANT
GOWN STRL REUS W/TWL LRG LVL3 (GOWN DISPOSABLE) ×1
MARKER SKIN XFINE TIP W/RULER (MISCELLANEOUS) ×1 IMPLANT
NDL FILTER BLUNT 18X1 1/2 (NEEDLE) ×1 IMPLANT
NDL HYPO 30X.5 LL (NEEDLE) ×2 IMPLANT
NEEDLE FILTER BLUNT 18X1 1/2 (NEEDLE) ×1 IMPLANT
NEEDLE HYPO 30X.5 LL (NEEDLE) ×2 IMPLANT
PACK ENT CUSTOM (PACKS) ×1 IMPLANT
SOL PREP PVP 2OZ (MISCELLANEOUS) ×1
SOLUTION PREP PVP 2OZ (MISCELLANEOUS) ×1 IMPLANT
SPONGE GAUZE 2X2 8PLY STRL LF (GAUZE/BANDAGES/DRESSINGS) ×10 IMPLANT
SUT GUT PLAIN 6-0 1X18 ABS (SUTURE) ×1 IMPLANT
SUT PROLENE 6 0 P 1 18 (SUTURE) IMPLANT
SYR 10ML LL (SYRINGE) ×1 IMPLANT
SYR 3ML LL SCALE MARK (SYRINGE) ×1 IMPLANT
WATER STERILE IRR 250ML POUR (IV SOLUTION) ×1 IMPLANT

## 2022-02-09 NOTE — Anesthesia Preprocedure Evaluation (Addendum)
Anesthesia Evaluation  Patient identified by MRN, date of birth, ID band Patient awake    Reviewed: Allergy & Precautions, H&P , NPO status , Patient's Chart, lab work & pertinent test results  History of Anesthesia Complications (+) PONV and history of anesthetic complications  Airway Mallampati: II  TM Distance: >3 FB Neck ROM: full    Dental no notable dental hx.    Pulmonary neg pulmonary ROS   Pulmonary exam normal        Cardiovascular negative cardio ROS Normal cardiovascular exam     Neuro/Psych negative neurological ROS  negative psych ROS   GI/Hepatic negative GI ROS, Neg liver ROS,,,  Endo/Other  negative endocrine ROS    Renal/GU      Musculoskeletal   Abdominal Normal abdominal exam  (+)   Peds  Hematology negative hematology ROS (+)   Anesthesia Other Findings Past Medical History: No date: Allergy     Comment:  food, never tested No date: Arthritis No date: Complication of anesthesia No date: Family history of adverse reaction to anesthesia     Comment:  Adopted No date: Ovarian cyst No date: PONV (postoperative nausea and vomiting) No date: UTI (urinary tract infection) No date: Wears hearing aid in both ears  Past Surgical History: 08/24/2015: CHONDROPLASTY; Left     Comment:  Procedure: CHONDROPLASTY;  Surgeon: Dereck Leep, MD;               Location: [facility];  Service: Orthopedics;  Laterality:               Left; 08/24/2015: KNEE ARTHROSCOPY WITH LATERAL MENISECTOMY; Left     Comment:  Procedure: KNEE ARTHROSCOPY WITH LATERAL MENISECTOMY;                Surgeon: Dereck Leep, MD;  Location: [facility];                Service: Orthopedics;  Laterality: Left; 05/27/2014: KNEE ARTHROSCOPY WITH MEDIAL MENISECTOMY; Left     Comment:  Procedure: KNEE ARTHROSCOPY WITH  PARTIAL MEDIAL               MENISECTOMY, SYNOVECTOMY LEFT;  Surgeon: Hessie Knows,               MD;  Location: [facility];   Service: Orthopedics;                Laterality: Left; 08/24/2015: KNEE ARTHROSCOPY WITH MEDIAL MENISECTOMY; Left     Comment:  Procedure: KNEE ARTHROSCOPY WITH MEDIAL MENISECTOMY;                Surgeon: Dereck Leep, MD;  Location: [facility];                Service: Orthopedics;  Laterality: Left; 11/05/2016: OPEN REDUCTION INTERNAL FIXATION (ORIF) DISTAL RADIAL  FRACTURE; Right     Comment:  Procedure: OPEN REDUCTION INTERNAL FIXATION (ORIF)               DISTAL RADIAL FRACTURE;  Surgeon: Iran Planas, MD;                Location: [place];  Service: Orthopedics;  Laterality:               Right;  BMI    Body Mass Index: 21.47 kg/m      Reproductive/Obstetrics negative OB ROS  Anesthesia Physical Anesthesia Plan  ASA: 1  Anesthesia Plan: MAC   Post-op Pain Management:    Induction:   PONV Risk Score and Plan: TIVA  Airway Management Planned: Natural Airway  Additional Equipment:   Intra-op Plan:   Post-operative Plan:   Informed Consent: I have reviewed the patients History and Physical, chart, labs and discussed the procedure including the risks, benefits and alternatives for the proposed anesthesia with the patient or authorized representative who has indicated his/her understanding and acceptance.       Plan Discussed with: Anesthesiologist, CRNA and Surgeon  Anesthesia Plan Comments:        Anesthesia Quick Evaluation

## 2022-02-09 NOTE — Anesthesia Postprocedure Evaluation (Signed)
Anesthesia Post Note  Patient: Cassandra Malone  Procedure(s) Performed: BLEPHAROPLASTY UPPER EYELID; W/EXCESS SKIN BLEPHAROPTOSIS REPAIR; RESECT EX BILATERAL (Bilateral: Eye)  Patient location during evaluation: PACU Anesthesia Type: MAC Level of consciousness: awake and alert Pain management: pain level controlled Vital Signs Assessment: post-procedure vital signs reviewed and stable Respiratory status: spontaneous breathing, nonlabored ventilation and respiratory function stable Cardiovascular status: stable and blood pressure returned to baseline Postop Assessment: no apparent nausea or vomiting Anesthetic complications: no   No notable events documented.   Last Vitals:  Vitals:   02/09/22 1318 02/09/22 1324  BP: 105/61 115/76  Pulse: 72 65  Resp: 14 15  Temp: (!) 36.4 C 36.4 C  SpO2: 95% 97%    Last Pain:  Vitals:   02/09/22 1324  TempSrc:   PainSc: 0-No pain                 Iran Ouch

## 2022-02-09 NOTE — Interval H&P Note (Signed)
History and Physical Interval Note:  02/09/2022 11:50 AM  Cassandra Malone  has presented today for surgery, with the diagnosis of H02.831 Dermatochalasis of Right Upper Eyelid H02.834 Dermatochalasis of Left Upper Eyelid H02.403 Ptosis of Eyelid, Unspecified, Bilateral.  The various methods of treatment have been discussed with the patient and family. After consideration of risks, benefits and other options for treatment, the patient has consented to  Procedure(s): BLEPHAROPLASTY UPPER EYELID; W/EXCESS SKIN BLEPHAROPTOSIS REPAIR; RESECT EX BILATERAL (Bilateral) as a surgical intervention.  The patient's history has been reviewed, patient examined, no change in status, stable for surgery.  I have reviewed the patient's chart and labs.  Questions were answered to the patient's satisfaction.     Vickki Muff, Ashea Winiarski M

## 2022-02-09 NOTE — Transfer of Care (Signed)
Immediate Anesthesia Transfer of Care Note  Patient: Cassandra Malone  Procedure(s) Performed: BLEPHAROPLASTY UPPER EYELID; W/EXCESS SKIN BLEPHAROPTOSIS REPAIR; RESECT EX BILATERAL (Bilateral: Eye)  Patient Location: PACU  Anesthesia Type: MAC  Level of Consciousness: awake, alert  and patient cooperative  Airway and Oxygen Therapy: Patient Spontanous Breathing and Patient connected to supplemental oxygen  Post-op Assessment: Post-op Vital signs reviewed, Patient's Cardiovascular Status Stable, Respiratory Function Stable, Patent Airway and No signs of Nausea or vomiting  Post-op Vital Signs: Reviewed and stable  Complications: No notable events documented.

## 2022-02-09 NOTE — H&P (Signed)
[place]: [facility]  Primary Care Physician:  [place] Pink, MD Ophthalmologist: Dr. Philis Pique. Vickki Muff, M.D.  Pre-Procedure History & Physical: HPI:  Cassandra Malone is a 67 y.o. female here for periocular surgery.   Past Medical History:  Diagnosis Date   Allergy    food, never tested   Arthritis    Complication of anesthesia    Family history of adverse reaction to anesthesia    Adopted   Ovarian cyst    PONV (postoperative nausea and vomiting)    UTI (urinary tract infection)    Wears hearing aid in both ears     Past Surgical History:  Procedure Laterality Date   CHONDROPLASTY Left 08/24/2015   Procedure: CHONDROPLASTY;  Surgeon: Dereck Leep, MD;  Location: [facility];  Service: Orthopedics;  Laterality: Left;   KNEE ARTHROSCOPY WITH LATERAL MENISECTOMY Left 08/24/2015   Procedure: KNEE ARTHROSCOPY WITH LATERAL MENISECTOMY;  Surgeon: Dereck Leep, MD;  Location: [facility];  Service: Orthopedics;  Laterality: Left;   KNEE ARTHROSCOPY WITH MEDIAL MENISECTOMY Left 05/27/2014   Procedure: KNEE ARTHROSCOPY WITH  PARTIAL MEDIAL MENISECTOMY, SYNOVECTOMY LEFT;  Surgeon: Hessie Knows, MD;  Location: [facility];  Service: Orthopedics;  Laterality: Left;   KNEE ARTHROSCOPY WITH MEDIAL MENISECTOMY Left 08/24/2015   Procedure: KNEE ARTHROSCOPY WITH MEDIAL MENISECTOMY;  Surgeon: Dereck Leep, MD;  Location: [facility];  Service: Orthopedics;  Laterality: Left;   OPEN REDUCTION INTERNAL FIXATION (ORIF) DISTAL RADIAL FRACTURE Right 11/05/2016   Procedure: OPEN REDUCTION INTERNAL FIXATION (ORIF) DISTAL RADIAL FRACTURE;  Surgeon: Iran Planas, MD;  Location: [place];  Service: Orthopedics;  Laterality: Right;    Prior to Admission medications   Medication Sig Start Date End Date Taking? Authorizing Provider  Ascorbic Acid (VITAMIN C) 1000 MG tablet Take 1,000 mg by mouth daily.   Yes [provider]  Boswellia Serrata (BOSWELLIA PO) Take by mouth.   Yes [provider]   cholecalciferol (VITAMIN D) 1000 units tablet Take 1,000 Units by mouth 2 (two) times daily.   Yes [provider]  co-enzyme Q-10 50 MG capsule Take 100 mg by mouth daily.   Yes [provider]  COLLAGEN PO Take 1,000 mg by mouth.   Yes [provider]  D-MANNOSE PO Take by mouth daily.   Yes [provider]  glucosamine-chondroitin 500-400 MG tablet Take 1 tablet by mouth 3 (three) times daily.   Yes [provider]  L-Theanine 100 MG CAPS Take 1 capsule by mouth 2 (two) times daily.    Yes [provider]  Magnesium 250 MG TABS Take by mouth.   Yes [provider]  Multiple Vitamins-Minerals (HAIR VITAMINS PO) Take 1 capsule by mouth 2 (two) times daily.    Yes [provider]  Nattokinase 100 MG CAPS Take 100 mg by mouth 2 (two) times daily.    Yes [provider]  OVER THE COUNTER MEDICATION Take 1 tablet by mouth daily. Relora   Yes [provider]  OVER THE COUNTER MEDICATION Take 1 tablet by mouth daily. DMAE   Yes [provider]  OVER THE COUNTER MEDICATION Take 1 tablet by mouth daily. glycine   Yes [provider]  OVER THE COUNTER MEDICATION daily. Agnes Lawrence [provider]  PHOSPHATIDYL CHOLINE PO Take 420 mg by mouth.   Yes [provider]  QUERCETIN PO Take by mouth daily.   Yes [provider]  Vinpocetine POWD Take 1  Dose by mouth 2 (two) times daily.    Yes [provider]  nitrofurantoin, macrocrystal-monohydrate, (MACROBID) 100 MG capsule Take 1 capsule (100 mg total) by mouth at bedtime. Patient not taking: Reported on 01/31/2022 04/05/17   [name], MD    Allergies as of 11/08/2021 - Review Complete 04/16/2017  Allergen Reaction Noted   Latex Rash 04/23/2012    Family History  Adopted: Yes  Problem Relation Age of Onset   Bladder Cancer Neg Hx    Kidney cancer Neg Hx     Social History   Socioeconomic  History   Marital status: Married    Spouse name: Not on file   Number of children: Not on file   Years of education: Not on file   Highest education level: Not on file  Occupational History   Not on file  Tobacco Use   Smoking status: Never   Smokeless tobacco: Never  Vaping Use   Vaping Use: Never used  Substance and Sexual Activity   Alcohol use: Yes    Alcohol/week: 1.0 standard drink of alcohol    Types: 1 Glasses of wine per week    Comment: 1 drink every 2 months   Drug use: No   Sexual activity: Not on file  Other Topics Concern   Not on file  Social History Narrative   Lives in [place] with husband. No children. Dog/cat.      Work - Engineer, building services      Diet - mostly vegetarian diet x chicken      Exercise - 3 days per week at [employer], treadmill   Social Determinants of Health   Financial Resource Strain: Not on file  Food Insecurity: Not on file  Transportation Needs: Not on file  Physical Activity: Not on file  Stress: Not on file  Social Connections: Not on file  Intimate Partner Violence: Not on file    Review of Systems: See HPI, otherwise negative ROS  Physical Exam: BP 136/81   Pulse 76   Temp 98.4 F (36.9 C) (Tympanic)   Resp 20   Ht 5\' 5"  (1.651 m)   Wt 60.8 kg   SpO2 99%   BMI 22.30 kg/m  General:   Alert and cooperative in NAD Head:  Normocephalic and atraumatic. Respiratory:  Normal work of breathing.  Impression/Plan: Cassandra Malone is here for periocular surgery.  Risks, benefits, limitations, and alternatives regarding surgery have been reviewed with the patient.  Questions have been answered.  All parties agreeable.   Karle Starch, MD  02/09/2022, 11:50 AM

## 2022-02-09 NOTE — Op Note (Signed)
Preoperative Diagnosis:  1. Visually significant blepharoptosis bilateral  Upper Eyelid(s) 2. Visually significant dermatochalasis bilateral  Upper Eyelid(s)  Postoperative Diagnosis:  Same.  Procedure(s) Performed:   1. Blepharoptosis repair with levator aponeurosis advancement bilateral  Upper Eyelid(s) 2. Upper eyelid blepharoplasty with excess skin excision  bilateral  Upper Eyelid(s)  Surgeon: Cassandra Malone. Cassandra Malone, M.D.  Assistants: none  Anesthesia: MAC  Specimens: None.  Estimated Blood Loss: Minimal.  Complications: None.  Operative Findings: None Dictated  Procedure:   Allergies were reviewed and the patient Latex.   After the risks, benefits, complications and alternatives were discussed with the patient, appropriate informed consent was obtained.  While seated in an upright position and looking in primary gaze, the mid pupillary line was marked on the upper eyelid margins bilaterally. The patient was then brought to the operating suite and reclined supine.  Timeout was conducted and the patient was sedated.  Local anesthetic consisting of a 50-50 mixture of 2% lidocaine with epinephrine and 0.75% bupivacaine with added Hylenex was injected subcutaneously to both  upper eyelid(s). After adequate local was instilled, the patient was prepped and draped in the usual sterile fashion for eyelid surgery.   Attention was turned to the upper eyelids. A 76m upper eyelid crease incision line was marked with calipers on both  upper eyelid(s).  A pinch test was used to estimate the amount of excess skin to remove and this was marked in standard blepharoplasty style fashion. Attention was turned to the  right  upper eyelid. A #15 blade was used to open the premarked incision line. A Skin only flap was excised and hemostasis was obtained with bipolar cautery.   Westcott scissors were then used to transect through orbicularis down to the tarsal plate. Epitarsus was dissected to create a  smooth surface to suture to. Dissection was then carried superiorly in the plane between orbicularis and orbital septum. Once the preaponeurotic fat pocket was identified, the orbital septum was opened. This revealed the levator and its aponeurosis.    Attention was then turned to the opposite eyelid where the same procedure was performed in the same manner. Hemostasis was obtained with bipolar cautery throughout.   3 interrupted 6-0 Prolene sutures were then passed partial thickness through the tarsal plates of both  upper eyelid(s). These sutures were placed in line with the mid pupillary, medial limbal, and lateral limbal lines. The sutures were fixed to the levator aponeurosis and adjusted until a nice lid height and contour were achieved. Once nice symmetry was achieved, the skin incisions were closed with a running 6-0 plain gut suture. The patient tolerated the procedure well.  Erythromycin ophthalmic ophthalmic ointment was applied to the incision site(s) followed by ice packs. The patient was taken to the recovery area where she recovered without difficulty.  Post-Op Plan/Instructions:   The patient was instructed to use ice packs frequently for the next 48 hours. She was instructed to use Erythromycin ophthalmic ophthalmic ointment on her incisions 4 times a day for the next 12 to 14 days. Shewas given a prescription for tramadol (or similar) for pain control should Tylenol not be effective. [facility] [place] [place] [facility] or sooner as needed for problems.  Cassandra Malone M. FVickki Malone M.D. Ophthalmology

## 2022-02-12 ENCOUNTER — Encounter: Payer: Self-pay | Admitting: Ophthalmology

## 2022-03-27 IMAGING — MG MM DIGITAL SCREENING BILAT W/ TOMO AND CAD
8 series · 9 of 24 positions shown · non-contrast
Comparison: Previous exam(s).

CLINICAL DATA: Screening.

EXAM:
DIGITAL SCREENING BILATERAL MAMMOGRAM WITH TOMOSYNTHESIS AND CAD
TECHNIQUE: Bilateral screening digital craniocaudal and mediolateral oblique
mammograms were obtained. Bilateral screening digital breast
tomosynthesis was performed. The images were evaluated with
computer-aided detection.

[L CC synth-2D]
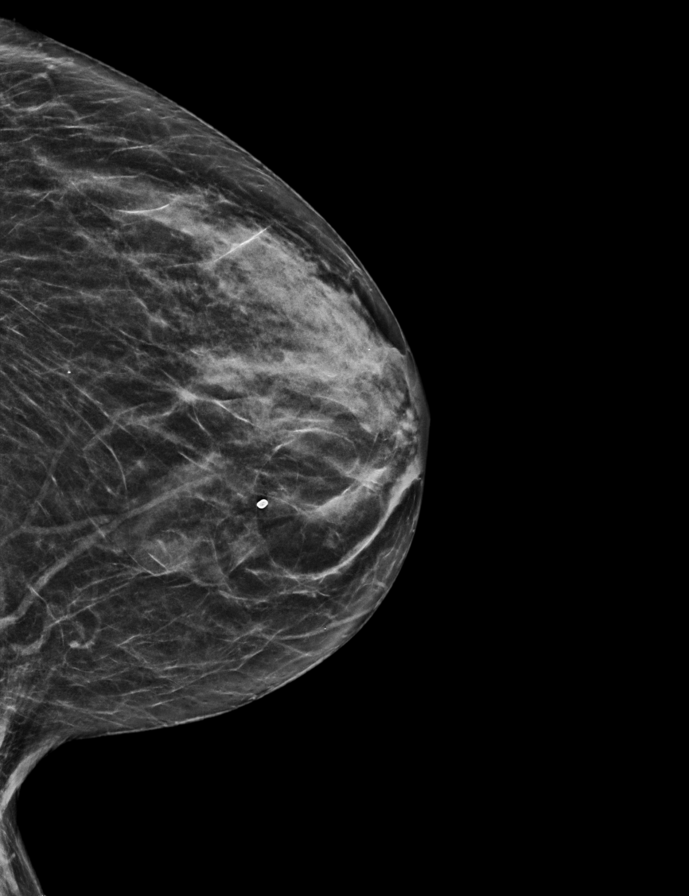

[R CC synth-2D]
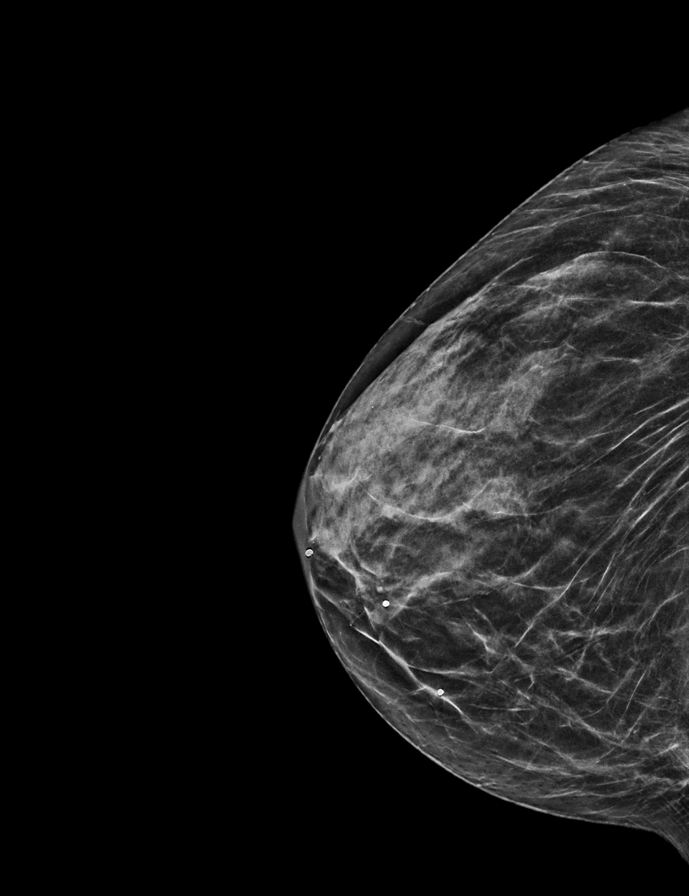

[R MLO synth-2D]
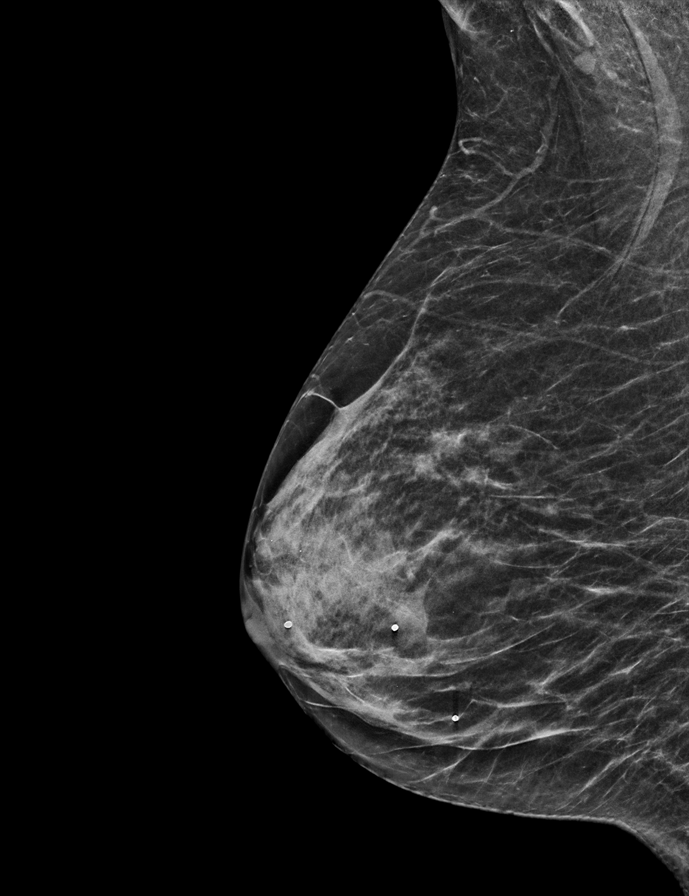

[L MLO synth-2D]
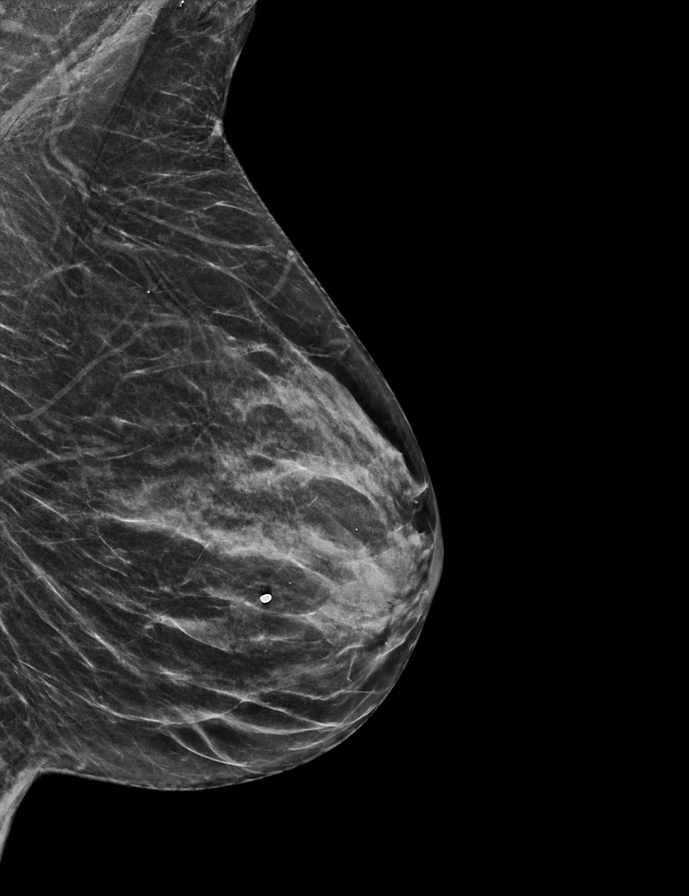

[L CC tomo · 2 of 45 frames shown]
[frame 15/45]
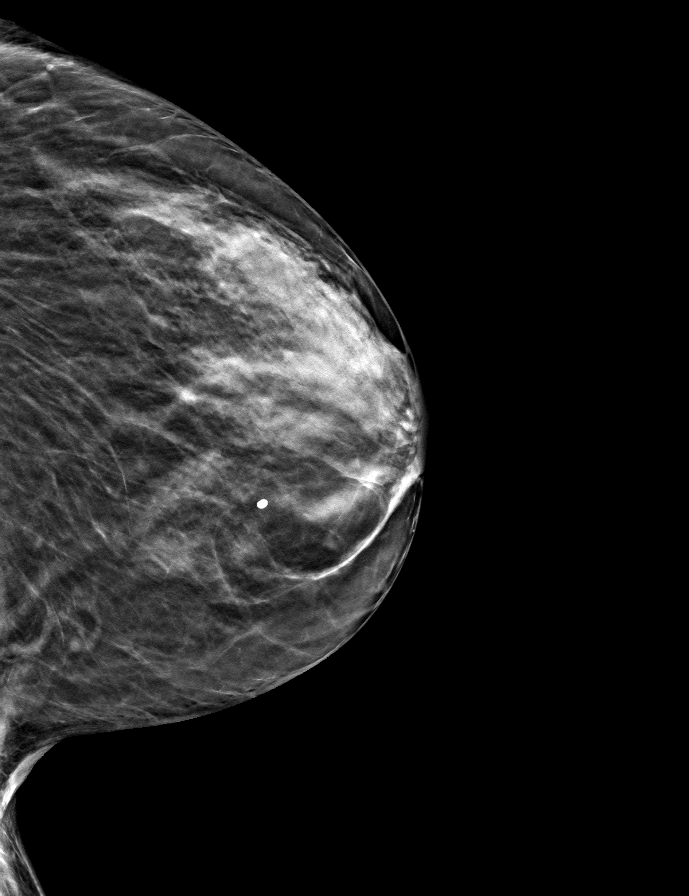
[frame 23/45]
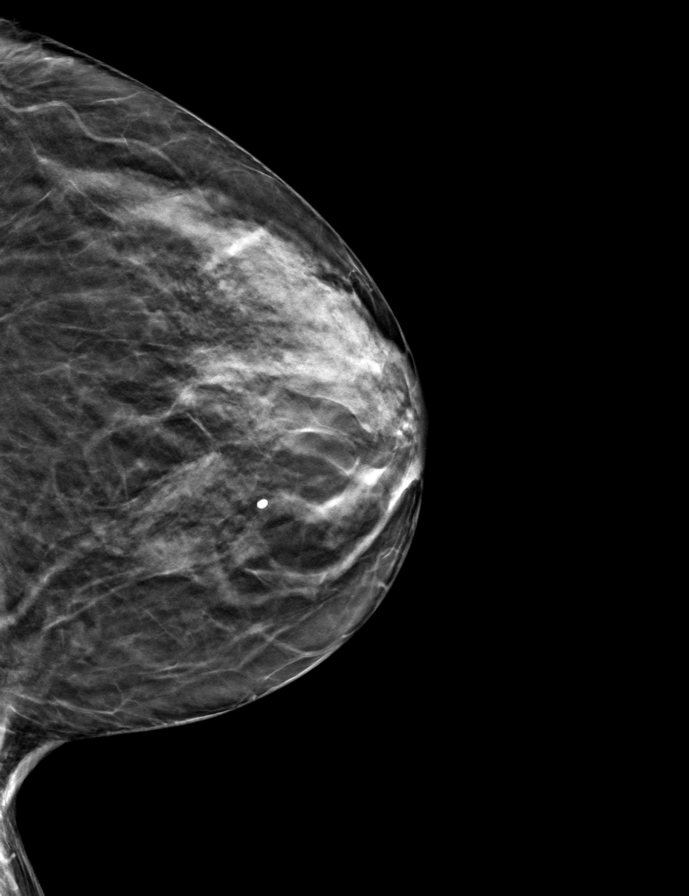

[R CC tomo · tomo slice 23/44.0]
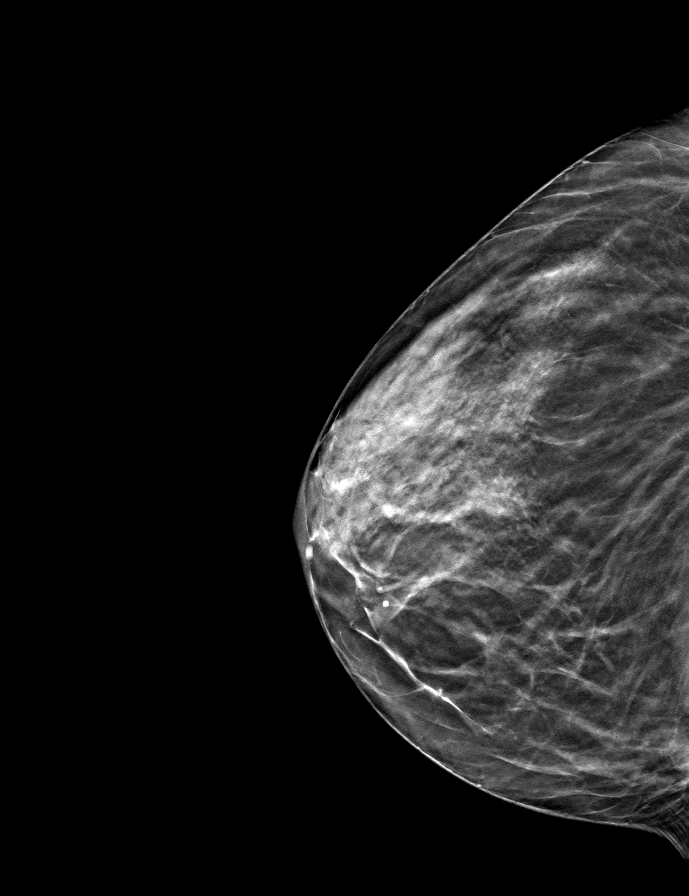

[R MLO tomo · tomo slice 23/45.0]
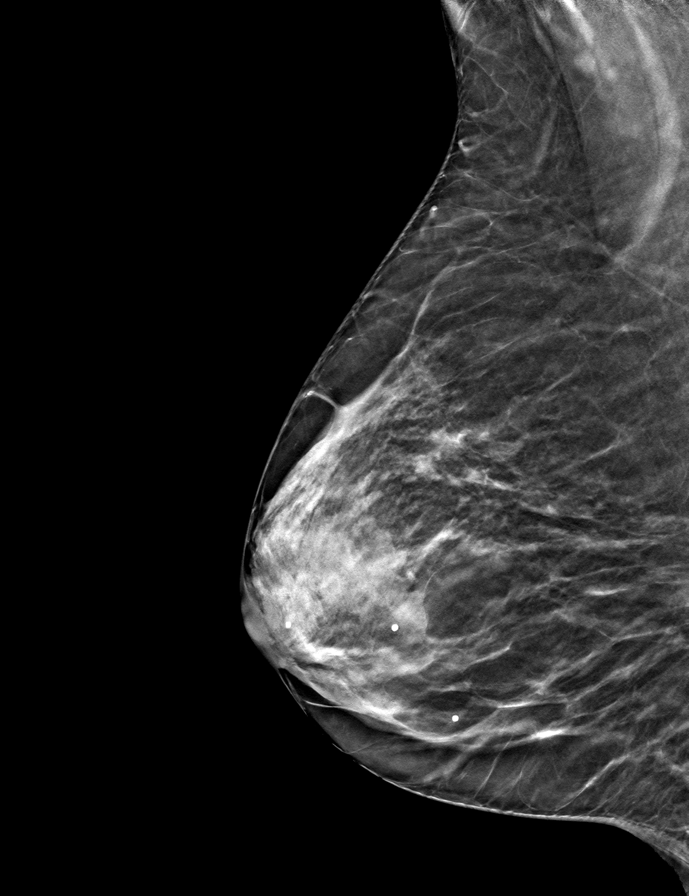

[L MLO tomo · tomo slice 25/48.0]
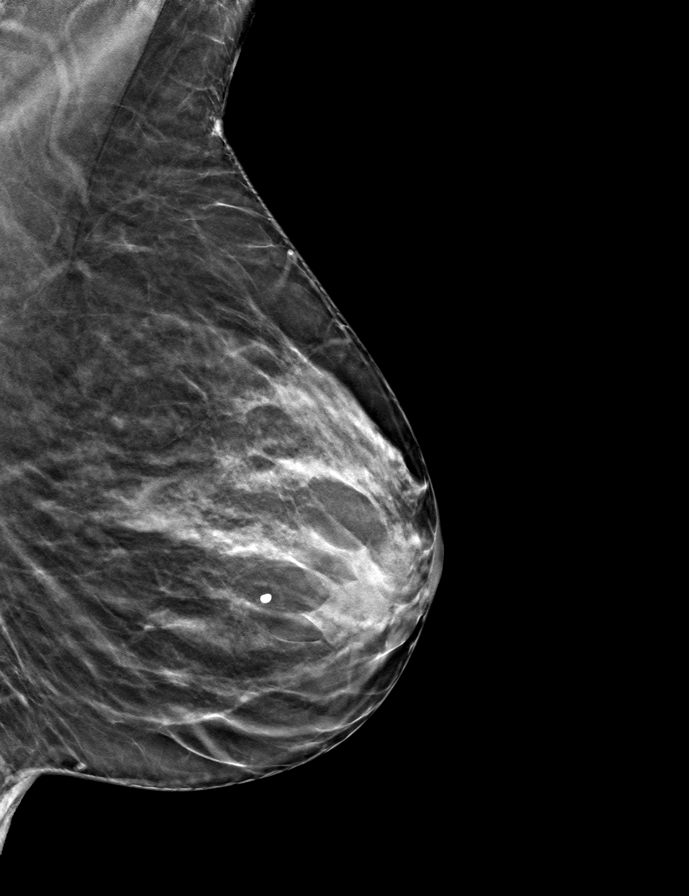

[9 of 24 positions shown; findings below may reference images not displayed]

ACR Breast Density Category c: The breast tissue is heterogeneously
dense, which may obscure small masses.
FINDINGS: There are no findings suspicious for malignancy.
IMPRESSION: No mammographic evidence of malignancy. A result letter of this
screening mammogram will be mailed directly to the patient.

RECOMMENDATION:
Screening mammogram in one year. (Code:Q3-W-BC3)

BI-RADS CATEGORY  1: Negative.

## 2022-03-29 LAB — EXTERNAL GENERIC LAB PROCEDURE: COLOGUARD: NEGATIVE

## 2022-04-10 ENCOUNTER — Other Ambulatory Visit: Payer: Self-pay

## 2022-04-10 DIAGNOSIS — Z1231 Encounter for screening mammogram for malignant neoplasm of breast: Secondary | ICD-10-CM

## 2022-04-26 ENCOUNTER — Ambulatory Visit
Admission: RE | Admit: 2022-04-26 | Discharge: 2022-04-26 | Disposition: A | Discharge status: Home / Self Care | Payer: Medicare Other | Source: Ambulatory Visit | Attending: Family Medicine | Admitting: Family Medicine

## 2022-04-26 DIAGNOSIS — Z1231 Encounter for screening mammogram for malignant neoplasm of breast: Secondary | ICD-10-CM | POA: Insufficient documentation

## 2023-04-10 ENCOUNTER — Other Ambulatory Visit: Payer: Self-pay | Admitting: Family Medicine

## 2023-04-10 DIAGNOSIS — Z1231 Encounter for screening mammogram for malignant neoplasm of breast: Secondary | ICD-10-CM

## 2023-04-29 ENCOUNTER — Ambulatory Visit
Admission: RE | Admit: 2023-04-29 | Discharge: 2023-04-29 | Disposition: A | Discharge status: Home / Self Care | Source: Ambulatory Visit | Attending: Family Medicine | Admitting: Family Medicine

## 2023-04-29 DIAGNOSIS — Z1231 Encounter for screening mammogram for malignant neoplasm of breast: Secondary | ICD-10-CM | POA: Insufficient documentation
